# Patient Record
Sex: Female | Born: 1970 | Race: White | Hispanic: No | Smoking: Never smoker
Health system: Southern US, Community
[De-identification: ages and names within clinical notes are randomized; demographics above are authoritative.]

## PROBLEM LIST (undated history)

## (undated) VITALS — BP 102/68 | HR 94 | Temp 98.7°F | Resp 16 | Ht 62.0 in | Wt 185.0 lb

## (undated) VITALS — BP 122/60 | HR 80 | Temp 97.4°F | Resp 20 | Ht 62.75 in | Wt 199.0 lb

## (undated) DIAGNOSIS — M1990 Unspecified osteoarthritis, unspecified site: Secondary | ICD-10-CM

## (undated) DIAGNOSIS — F329 Major depressive disorder, single episode, unspecified: Secondary | ICD-10-CM

## (undated) DIAGNOSIS — G43909 Migraine, unspecified, not intractable, without status migrainosus: Secondary | ICD-10-CM

## (undated) DIAGNOSIS — F4310 Post-traumatic stress disorder, unspecified: Secondary | ICD-10-CM

## (undated) DIAGNOSIS — F32A Depression, unspecified: Secondary | ICD-10-CM

## (undated) DIAGNOSIS — F3111 Bipolar disorder, current episode manic without psychotic features, mild: Secondary | ICD-10-CM

## (undated) DIAGNOSIS — F419 Anxiety disorder, unspecified: Secondary | ICD-10-CM

## (undated) HISTORY — DX: Post-traumatic stress disorder, unspecified: F43.10

## (undated) HISTORY — PX: CHOLECYSTECTOMY: SHX55

---

## 2011-03-25 ENCOUNTER — Encounter (HOSPITAL_COMMUNITY): Payer: Self-pay | Admitting: *Deleted

## 2011-03-25 ENCOUNTER — Ambulatory Visit (HOSPITAL_COMMUNITY)
Admission: RE | Admit: 2011-03-25 | Discharge: 2011-03-25 | Disposition: A | Discharge status: Home / Self Care | Payer: BC Managed Care – PPO | Attending: Psychiatry | Admitting: Psychiatry

## 2011-03-25 DIAGNOSIS — F411 Generalized anxiety disorder: Secondary | ICD-10-CM | POA: Insufficient documentation

## 2011-03-25 DIAGNOSIS — F331 Major depressive disorder, recurrent, moderate: Secondary | ICD-10-CM | POA: Insufficient documentation

## 2011-03-25 HISTORY — DX: Unspecified osteoarthritis, unspecified site: M19.90

## 2011-03-25 HISTORY — DX: Migraine, unspecified, not intractable, without status migrainosus: G43.909
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
## 2011-04-09 ENCOUNTER — Inpatient Hospital Stay (HOSPITAL_COMMUNITY)
Admission: RE | Admit: 2011-04-09 | Discharge: 2011-04-13 | DRG: 430 | Disposition: A | Discharge status: Home / Self Care | Payer: BC Managed Care – PPO | Attending: Psychiatry | Admitting: Psychiatry

## 2011-04-09 ENCOUNTER — Encounter (HOSPITAL_COMMUNITY): Payer: Self-pay | Admitting: *Deleted

## 2011-04-09 ENCOUNTER — Emergency Department (HOSPITAL_COMMUNITY)
Admission: EM | Admit: 2011-04-09 | Discharge: 2011-04-09 | Disposition: A | Discharge status: Other Acute Inpatient Hospital | Payer: BC Managed Care – PPO | Attending: Emergency Medicine | Admitting: Emergency Medicine

## 2011-04-09 DIAGNOSIS — IMO0002 Reserved for concepts with insufficient information to code with codable children: Secondary | ICD-10-CM

## 2011-04-09 DIAGNOSIS — F4310 Post-traumatic stress disorder, unspecified: Secondary | ICD-10-CM | POA: Diagnosis present

## 2011-04-09 DIAGNOSIS — F329 Major depressive disorder, single episode, unspecified: Secondary | ICD-10-CM | POA: Insufficient documentation

## 2011-04-09 DIAGNOSIS — F339 Major depressive disorder, recurrent, unspecified: Principal | ICD-10-CM | POA: Diagnosis present

## 2011-04-09 DIAGNOSIS — F411 Generalized anxiety disorder: Secondary | ICD-10-CM | POA: Diagnosis present

## 2011-04-09 DIAGNOSIS — K219 Gastro-esophageal reflux disease without esophagitis: Secondary | ICD-10-CM

## 2011-04-09 DIAGNOSIS — R45851 Suicidal ideations: Secondary | ICD-10-CM

## 2011-04-09 DIAGNOSIS — L4050 Arthropathic psoriasis, unspecified: Secondary | ICD-10-CM

## 2011-04-09 DIAGNOSIS — Z79899 Other long term (current) drug therapy: Secondary | ICD-10-CM

## 2011-04-09 DIAGNOSIS — G43909 Migraine, unspecified, not intractable, without status migrainosus: Secondary | ICD-10-CM

## 2011-04-09 HISTORY — DX: Depression, unspecified: F32.A

## 2011-04-09 HISTORY — DX: Anxiety disorder, unspecified: F41.9

## 2011-04-09 HISTORY — DX: Major depressive disorder, single episode, unspecified: F32.9

## 2011-04-09 LAB — COMPREHENSIVE METABOLIC PANEL
ALT: 11 U/L (ref 0–35)
Albumin: 3.6 g/dL (ref 3.5–5.2)
Alkaline Phosphatase: 71 U/L (ref 39–117)
GFR calc Af Amer: >90 mL/min (ref >90)
Glucose, Bld: 144 mg/dL — ABNORMAL HIGH (ref 70–99)
Potassium: 3.6 mEq/L (ref 3.5–5.1)
Sodium: 136 mEq/L (ref 135–145)
Total Protein: 7.2 g/dL (ref 6.0–8.3)

## 2011-04-09 LAB — ETHANOL: Alcohol, Ethyl (B): <11 mg/dL (ref 0–11)

## 2011-04-09 LAB — CBC
Hemoglobin: 14.4 g/dL (ref 12.0–15.0)
MCHC: 34.6 g/dL (ref 30.0–36.0)

## 2011-04-09 LAB — RAPID URINE DRUG SCREEN, HOSP PERFORMED
Amphetamines: NOT DETECTED
Barbiturates: NOT DETECTED
Tetrahydrocannabinol: NOT DETECTED

## 2011-04-09 LAB — PREGNANCY, URINE: Preg Test, Ur: NEGATIVE

## 2011-04-09 MED ORDER — ZOLPIDEM TARTRATE 5 MG PO TABS: 5.0000 mg | ORAL_TABLET | Freq: Every evening | ORAL | Status: DC | PRN

## 2011-04-09 MED ORDER — ACETAMINOPHEN 325 MG PO TABS: 650.0000 mg | ORAL_TABLET | Freq: Four times a day (QID) | ORAL | Status: DC | PRN

## 2011-04-09 MED ORDER — ALUM & MAG HYDROXIDE-SIMETH 200-200-20 MG/5ML PO SUSP: 30.0000 mL | ORAL | Status: DC | PRN

## 2011-04-09 MED ORDER — LORAZEPAM 1 MG PO TABS: 1.0000 mg | ORAL_TABLET | Freq: Three times a day (TID) | ORAL | Status: DC | PRN

## 2011-04-09 MED ORDER — IBUPROFEN 600 MG PO TABS
600.0000 mg | ORAL_TABLET | Freq: Three times a day (TID) | ORAL | Status: DC | PRN
  Administered 2011-04-12: 600 mg via ORAL
  Filled 2011-04-09: qty 1

## 2011-04-09 MED ORDER — LORAZEPAM 1 MG PO TABS
1.0000 mg | ORAL_TABLET | Freq: Three times a day (TID) | ORAL | Status: DC | PRN
  Administered 2011-04-09: 1 mg via ORAL
  Filled 2011-04-09: qty 1

## 2011-04-09 MED ORDER — VENLAFAXINE HCL ER 150 MG PO CP24
150.0000 mg | ORAL_CAPSULE | Freq: Every day | ORAL | Status: DC
  Administered 2011-04-10 – 2011-04-13 (×4): 150 mg via ORAL
  Filled 2011-04-09 (×4): qty 1
  Filled 2011-04-09: qty 2
  Filled 2011-04-09: qty 1

## 2011-04-09 MED ORDER — MAGNESIUM HYDROXIDE 400 MG/5ML PO SUSP: 30.0000 mL | Freq: Every day | ORAL | Status: DC | PRN

## 2011-04-09 MED ORDER — ZOLPIDEM TARTRATE 5 MG PO TABS
5.0000 mg | ORAL_TABLET | Freq: Every evening | ORAL | Status: DC | PRN
  Administered 2011-04-09: 5 mg via ORAL
  Filled 2011-04-09: qty 1

## 2011-04-09 MED ORDER — ACETAMINOPHEN 325 MG PO TABS: 650.0000 mg | ORAL_TABLET | ORAL | Status: DC | PRN

## 2011-04-09 MED ORDER — ONDANSETRON HCL 4 MG PO TABS: 4.0000 mg | ORAL_TABLET | Freq: Three times a day (TID) | ORAL | Status: DC | PRN

## 2011-04-09 MED ORDER — IBUPROFEN 600 MG PO TABS: 600.0000 mg | ORAL_TABLET | Freq: Three times a day (TID) | ORAL | Status: DC | PRN

## 2011-04-09 NOTE — ED Notes (Signed)
Pt states this morning she cut her wrists bilat in an attempt to kill herself.  Pt states before she started to cut herself she "wanted to die, but once I started I couldn't do it."  Pt has superficial scratches to anterior wrists bilat.  Pt states suicidal thoughts have been increasing over past week.  Pt states she has had thoughts of hurting herself in past, but denies every acting on those thoughts.  Pt was seen at [facility] and assessed by ACT team prior to coming over to [facility].  Pt denies use of drugs or alcohol.
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
## 2011-04-09 NOTE — ED Notes (Signed)
Chris RN contacted-ok to transport w/o preg results

## 2011-04-09 NOTE — ED Notes (Signed)
Pt transported to [facility]  By security w/ RN. Husband to meet pt there
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
## 2011-04-09 NOTE — ED Notes (Signed)
Security contacted for transport.

## 2011-04-09 NOTE — ED Notes (Signed)
Bobby-ACT-into see 

## 2011-04-09 NOTE — ED Provider Notes (Addendum)
History     CSN: [id number] Arrival date & time: 04/09/2011 11:41 AM   First MD Initiated Contact with Patient 04/09/11 1159      Chief Complaint  Patient presents with  . Suicidal    (Consider location/radiation/quality/duration/timing/severity/associated sxs/prior treatment) The history is provided by the patient.   40 year old female with a long history of depression states that her depression has been worse over the last week. There is no specific precipitating event, but she has been having flashbacks during this time. Having suicidal thoughts during this time and this morning she tried to kill herself. She scratched her face and tried to cut her left wrist. She has had associated symptoms of early morning awakening and anhedonia, but she has not had crying spells. Symptoms are described as severe. Some better, nothing makes them worse. Past Medical History  Diagnosis Date  . Arthritis   . Migraine headache   . Depression   . Anxiety     Past Surgical History  Procedure Date  . Cholecystectomy     No family history on file.  History  Substance Use Topics  . Smoking status: Never Smoker   . Smokeless tobacco: Never Used  . Alcohol Use: No    OB History    Grav Para Term Preterm Abortions TAB SAB Ect Mult Living                  Review of Systems  All other systems reviewed and are negative.    Allergies  Seasonal  Home Medications   Current Outpatient Rx  Name Route Sig Dispense Refill  . ALPRAZOLAM ER 1 MG PO TB24 Oral Take 1 mg by mouth 2 (two) times daily.      Marland Kitchen CETIRIZINE HCL 10 MG PO TABS Oral Take 10 mg by mouth as needed. Pt did not have dosage information     . ETANERCEPT 50 MG/ML [place] SOLN Subcutaneous Inject 50 mg into the skin once a week. Pt administers on Sundays     . MELATONIN 10 MG PO TABS Oral Take 10 mg by mouth at bedtime as needed. sleep     . NORGESTIMATE-ETH ESTRADIOL 0.25-35 MG-MCG PO TABS Oral Take 1 tablet by mouth daily.        . SUMATRIPTAN SUCCINATE 100 MG PO TABS Oral Take 100 mg by mouth as needed.      . VENLAFAXINE HCL 150 MG PO CP24 Oral Take 150 mg by mouth daily.      Marland Kitchen ZOLPIDEM TARTRATE 10 MG PO TABS Oral Take 10 mg by mouth at bedtime as needed. Pt takes every other night due to cost.     . FLUTICASONE PROPIONATE 50 MCG/ACT NA SUSP Nasal Place 2 sprays into the nose daily. Pt did not have dosage information.       BP 116/50  Pulse 86  Temp(Src) 98.5 F (36.9 C) (Oral)  Resp 18  SpO2 99%  Physical Exam  Nursing note and vitals reviewed.  40-year-old female who appears overtly depressed but in no acute distress.. Head is normocephalic and atraumatic. PERRLA, EOMI. Oropharynx is clear. Neck is supple without adenopathy or JVD. Back is nontender. Lungs are clear without rales, wheezes, or rhonchi. Heart has regular rate and rhythm without murmur. Abdomen is soft, flat, nontender without masses or hepatosplenomegaly. These have no cyanosis or edema. Skin is warm and moist without rash. Neurologic: He is awake, alert, oriented x3. Cranial nerves are intact. There no focal motor or sensory deficits.  Psychiatric: She appears depressed with a flat affect. She makes poor eye contact and speaks in a soft monotone. ED Course  Procedures (including critical care time)   Labs Reviewed  CBC  COMPREHENSIVE METABOLIC PANEL  URINE RAPID DRUG SCREEN (HOSP PERFORMED)  ETHANOL   No results found. Results for orders placed during the hospital encounter of 04/09/11  CBC      Component Value Range   WBC 8.9  4.0 - 10.5 (K/uL)   RBC 4.62  3.87 - 5.11 (MIL/uL)   Hemoglobin 14.4  12.0 - 15.0 (g/dL)   HCT 40.9  81.1 - 91.4 (%)   MCV 90.0  78.0 - 100.0 (fL)   MCH 31.2  26.0 - 34.0 (pg)   MCHC 34.6  30.0 - 36.0 (g/dL)   RDW 78.2  95.6 - 21.3 (%)   Platelets 294  150 - 400 (K/uL)  COMPREHENSIVE METABOLIC PANEL      Component Value Range   Sodium 136  135 - 145 (mEq/L)   Potassium 3.6  3.5 - 5.1 (mEq/L)   Chloride  100  96 - 112 (mEq/L)   CO2 27  19 - 32 (mEq/L)   Glucose, Bld 144 (*) 70 - 99 (mg/dL)   BUN 13  6 - 23 (mg/dL)   Creatinine, Ser 0.86  0.50 - 1.10 (mg/dL)   Calcium 9.6  8.4 - 57.8 (mg/dL)   Total Protein 7.2  6.0 - 8.3 (g/dL)   Albumin 3.6  3.5 - 5.2 (g/dL)   AST 19  0 - 37 (U/L)   ALT 11  0 - 35 (U/L)   Alkaline Phosphatase 71  39 - 117 (U/L)   Total Bilirubin 0.4  0.3 - 1.2 (mg/dL)   GFR calc non Af Amer >90  >90 (mL/min)   GFR calc Af Amer >90  >90 (mL/min)  URINE RAPID DRUG SCREEN (HOSP PERFORMED)      Component Value Range   Opiates NONE DETECTED  NONE DETECTED    Cocaine NONE DETECTED  NONE DETECTED    Benzodiazepines POSITIVE (*) NONE DETECTED    Amphetamines NONE DETECTED  NONE DETECTED    Tetrahydrocannabinol NONE DETECTED  NONE DETECTED    Barbiturates NONE DETECTED  NONE DETECTED   ETHANOL      Component Value Range   Alcohol, Ethyl (B) <11  0 - 11 (mg/dL)   No results found.    1. Major depression    Patient has been accepted at [facility] and will be discharged.   MDM  Major depression with suicidal ideation and weak suicide attempts. She will need inpatient psychiatric care.        Dione Booze, MD 04/09/11 1430  Dione Booze, MD 05/19/11 [phone]

## 2011-04-09 NOTE — ED Notes (Signed)
1 bag of belongings sent w/ pt 

## 2011-04-10 DIAGNOSIS — F411 Generalized anxiety disorder: Secondary | ICD-10-CM | POA: Diagnosis present

## 2011-04-10 DIAGNOSIS — F339 Major depressive disorder, recurrent, unspecified: Secondary | ICD-10-CM | POA: Diagnosis present

## 2011-04-10 DIAGNOSIS — F4310 Post-traumatic stress disorder, unspecified: Secondary | ICD-10-CM | POA: Diagnosis present

## 2011-04-10 DIAGNOSIS — F39 Unspecified mood [affective] disorder: Secondary | ICD-10-CM

## 2011-04-10 MED ORDER — ALPRAZOLAM ER 0.5 MG PO TB24
1.0000 mg | ORAL_TABLET | Freq: Two times a day (BID) | ORAL | Status: DC
  Administered 2011-04-10 – 2011-04-13 (×6): 1 mg via ORAL
  Filled 2011-04-10 (×6): qty 2

## 2011-04-10 MED ORDER — NORGESTIMATE-ETH ESTRADIOL 0.25-35 MG-MCG PO TABS
1.0000 | ORAL_TABLET | Freq: Every day | ORAL | Status: DC
  Administered 2011-04-10 – 2011-04-12 (×3): 1 via ORAL

## 2011-04-10 MED ORDER — ETANERCEPT 50 MG/ML ~~LOC~~ SOLN
50.0000 mg | SUBCUTANEOUS | Status: DC
  Administered 2011-04-10: 50 mg via SUBCUTANEOUS

## 2011-04-10 MED ORDER — NORGESTIMATE-ETH ESTRADIOL 0.25-35 MG-MCG PO TABS: 1.0000 | ORAL_TABLET | Freq: Every day | ORAL | Status: DC

## 2011-04-10 MED ORDER — LORATADINE 10 MG PO TABS
10.0000 mg | ORAL_TABLET | Freq: Every day | ORAL | Status: DC | PRN
  Administered 2011-04-10 – 2011-04-12 (×3): 10 mg via ORAL
  Filled 2011-04-10 (×3): qty 1

## 2011-04-10 MED ORDER — QUETIAPINE FUMARATE 50 MG PO TABS
50.0000 mg | ORAL_TABLET | Freq: Every day | ORAL | Status: DC
  Administered 2011-04-10: 50 mg via ORAL
  Filled 2011-04-10 (×3): qty 1

## 2011-04-10 MED ORDER — QUETIAPINE FUMARATE 25 MG PO TABS: 25.0000 mg | ORAL_TABLET | Freq: Four times a day (QID) | ORAL | Status: DC | PRN

## 2011-04-10 MED ORDER — SALINE SPRAY 0.65 % NA SOLN
1.0000 | Freq: Three times a day (TID) | NASAL | Status: DC
  Administered 2011-04-10 – 2011-04-13 (×10): 1 via NASAL
  Filled 2011-04-10: qty 44

## 2011-04-10 MED ORDER — SUMATRIPTAN SUCCINATE 100 MG PO TABS
100.0000 mg | ORAL_TABLET | ORAL | Status: DC | PRN
  Administered 2011-04-12 (×2): 100 mg via ORAL
  Filled 2011-04-10 (×2): qty 1
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
## 2011-04-10 NOTE — Progress Notes (Signed)
[facility] Group Notes:  (Counselor/Nursing/MHT/Case Management/Adjunct)  04/10/2011 2:27 PM  Type of Therapy:  group therapy  Participation Level:  Minimal  Participation Quality:  Drowsy  Affect:  Flat  Cognitive:  Oriented  Insight:  Limited  Engagement in Group:  Limited  Engagement in Therapy:  Limited  Modes of Intervention:  Problem-solving, Support and exploration  Summary of Progress/Problems: Pt shared support for her is being heard by others, pt stated if he supports fell through she could turn to church. Pt shared to support herself she plans to say 25 positive things to herself.   Purcell Nails 04/10/2011, 2:27 PM

## 2011-04-10 NOTE — Progress Notes (Signed)
Patient stated she is feeling better today.  Denied SI and HI.   Denied A/V hallucinations.   Denied pain.   Groups are very helpful to her today.   Has been cooperative and pleasant.

## 2011-04-10 NOTE — Progress Notes (Signed)
[facility] Adult Inpatient Family/Significant Other Suicide Prevention Education  Suicide Prevention Education:  Education Completed; Alinda Money (husband) [phone],  (name of family member/significant other) has been identified by the patient as the family member/significant other with whom the patient will be residing, and identified as the person(s) who will aid the patient in the event of a mental health crisis (suicidal ideations/suicide attempt).  With written consent from the patient, the family member/significant other has been provided the following suicide prevention education, prior to the and/or following the discharge of the patient.  The suicide prevention education provided includes the following:  Suicide risk factors  Suicide prevention and interventions  National Suicide Hotline telephone number  [facility] assessment telephone number  [facility] Emergency Assistance 911  [facility] and/or Residential Mobile Crisis Unit telephone number  Request made of family/significant other to:  Remove weapons (e.g., guns, rifles, knives), all items previously/currently identified as safety concern.  ESP get kitchen knives out of site and reach.  Remove drugs/medications (over-the-counter, prescriptions, illicit drugs), all items previously/currently identified as a safety concern.  The family member/significant other verbalizes understanding of the suicide prevention education information provided. The family member/significant other agrees to remove the items of safety concern listed above.  Pt. accepted information on suicide prevention, warning signs to look for with suicide and crisis line numbers to use. The pt. agreed to call crisis line numbers if having warning signs or having thoughts of suicide.    [facility] 04/10/2011, 10:58 AM

## 2011-04-10 NOTE — Progress Notes (Signed)
Pt had a good day today. Spent time with a counselor and then in group. States that she has heard things her whole life  And it has never hit her or has she been open to the information as she was today. States that  She has finally come to terms with the fact that she wants to be very good to herself. Denies SI and HI. Given support, reassurance and praise.
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
## 2011-04-10 NOTE — Progress Notes (Signed)
Patient ID: Katelyn [name], female   DOB: 03-29-1971, 40 y.o.   MRN: [id number] Pt. denies lethality or A/V/H's: asked for and was given Ambien and Ativan to help her sleep: she reports ongoing issues with insomnia.  Pt. Has been interacting in the DR without a problem. 06:00--No problems though the night.
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
## 2011-04-10 NOTE — Progress Notes (Signed)
10:22 AM 04/10/2011 Pt attended after care group, pt received and understood suicide prevention info. Including who is at risk, warning signs, what to do, and who to call.

## 2011-04-11 LAB — TSH: TSH: 1.685 u[IU]/mL (ref 0.350–4.500)

## 2011-04-11 MED ORDER — TRAZODONE 25 MG HALF TABLET
25.0000 mg | ORAL_TABLET | Freq: Every evening | ORAL | Status: DC | PRN
  Administered 2011-04-11 – 2011-04-12 (×2): 25 mg via ORAL
  Filled 2011-04-11 (×8): qty 1

## 2011-04-11 NOTE — Progress Notes (Addendum)
[facility] Group Notes: (Counselor/Nursing/MHT/Case Management/Adjunct)   Type of Therapy:  Group Therapy  Participation Level:  Active  Participation Quality:  Attentive, Appropriate, Sharing  Affect:  Blunted  Cognitive:  Appropriate  Insight:  Good  Engagement in Group: Good  Engagement in Therapy:  Good  Modes of Intervention:  Support and Exploration and Education  Summary of Progress/Problems: Katelyn [name] participated in breathing and progressive muscle relaxation techniques. She processed the experience afterward, saying that her therapist has tried to work with her in the past on mindfulness, but she never really understood the concept until she did this exercise. Katelyn [name] stated that she was able to clear her mind of other things and be fully present in her body in the moment.  [name] 04/11/2011  3:58 PM

## 2011-04-11 NOTE — Tx Team (Signed)
Interdisciplinary Treatment Plan Update (Adult)  Date:  04/11/2011  Time Reviewed:  10:16 AM   Progress in Treatment: Attending groups: Yes Participating in groups:  Yes, open with staff and other group members  Taking medication as prescribed:  Yes Tolerating medication: Yes Family/Significant othe contact made:  Yes, contact made with:husband, Alinda Money  Patient understands diagnosis: Yes Discussing patient identified problems/goals with staff:  Yes Medical problems stabilized or resolved: Yes Denies suicidal/homicidal ideation: Yes Issues/concerns per patient self-inventory:  No  Other:  New problem(s) identified: None  Reason for Continuation of Hospitalization: Depression  Interventions implemented related to continuation of hospitalization:  Medication stabilization, safety checks q 15 mins, group attendance  Additional comments:  Estimated length of stay: 2 days  Discharge Plan: discharge home, follow up with Telford Nab in [facility] goal(s):  Review of initial/current patient goals per problem list:   1.  Goal(s): Decrease depressive symptoms  Met:  No  Target date: by discharge  As evidenced by: Katelyn [name] rates depression at 5 today (goal is 3)  2.  Goal (s): Eliminate SI   Met:  Yes  Target date: by discharge  As evidenced by: Breshay denies SI today  3.  Goal(s): Increase comfort with medications and intent to take medications  Met:  Yes  Target date: by discharge  As evidenced by: Mattisyn reports medications and working and no intolerable side effects   Attendees: Patient:   04/11/2011 10:16 AM  Family:   04/11/2011 10:16 AM  Physician:   04/11/2011 10:16 AM  Nursing:   Quintella Reichert, RN 04/11/2011 10:16 AM  Case Manager:  Vanetta Mulders LPCA 04/11/2011 10:16 AM  Counselor:  Angus Palms, LCSW 04/11/2011 10:16 AM  Other:  Reyes Ivan, LCSWA 04/11/2011 10:16 AM  Other:  Lynann Bologna, NP 04/11/2011 10:16 AM  Other:   04/11/2011  10:16 AM  Other:   04/11/2011 10:16 AM   Scribe for Treatment Team:   [name], 04/11/2011 10:16 AM

## 2011-04-11 NOTE — Progress Notes (Signed)
Lying quietly in bed with eyes closed.  No complaints of pain or other discomfort.  Q 15 minute checks in place to maintain safety. The patient is asleep and safe at this time.

## 2011-04-11 NOTE — Progress Notes (Signed)
Patient has been cooperative and pleasant today.   Denied SI and HI, contracts for safety.   Denied pain.  Denied A/V hallucinations. On self inventory sheet, patient sleeps well, has improving appetite, low energy level, improving attention span.  Rated depression #5, hopelessness #1.  Denied withdrawals.  Denied SI.  Physical problems in past 24 hours, lightheaded, nausea.  Rated worst pain #3.  Plans to write in journal and swim after discharge to improve herself.

## 2011-04-11 NOTE — Progress Notes (Signed)
2:37 PM 04/11/2011                                   Case Management Note Pt shared her depression levels are at 5 today and her anxiety is at 1, pt states she will be returning home to her husband and he is able to provide transportation. Pt states she has both a Psy. And therapist in [place], pt will be set up for discharge appointments. Demari Gales, LPCA

## 2011-04-11 NOTE — Progress Notes (Addendum)
Recreation Therapy Group Note  Date: 04/11/2011         Time: 1145      Group Topic/Focus: Patient invited to participate in animal assisted therapy. Pets as a coping skill and responsibility were discussed.   Participation Level: Active  Participation Quality: Appropriate and Attentive  Affect: Appropriate  Cognitive: Appropriate and Oriented   Additional Comments: None

## 2011-04-11 NOTE — Progress Notes (Signed)
Patient ID: Katelyn [name], female   DOB: 1970/10/16, 40 y.o.   MRN: [id number]  Doing much better today.  Feels she had a 'breakthrough' with one of the counselors yesterday and has had no more suicidal thoughts.  We had started her on Seroquel for flashbacks but it has made her excessively sedated so we will stop that now, and just use Trazodone prn for sleep - since her insurance will not pay for the Ambien.    We discussed the need to consider another medication if the flashbacks recur or persist.  No flashbacks in the past 24 hours.    She is wondering if she can go home since she feels so much better and we are considering discharge tomorrow if she continues to do well.  Her husband is agreeable with her coming home.    Plan:  D/C Seroquel Trazodone 25mg  hs prn, mr x 1

## 2011-04-11 NOTE — Progress Notes (Signed)
[facility] Group Notes: (Counselor/Nursing/MHT/Case Management/Adjunct)   Type of Therapy:  Group Therapy  Participation Level:  Active  Participation Quality:  Attentive, Appropriate, Sharing  Affect:  Blunted  Cognitive:  Appropriate  Insight:  Good  Engagement in Group: Good  Engagement in Therapy:  Good  Modes of Intervention:  Support and Exploration  Summary of Progress/Problems: Katelyn [name] explored the obstacles to her wellness, which she identified as going back in to the outside world and being overwhelmed with stressors such as work, parenting, relationships. She shared that to her everyone is dealing with their own personal "demon" and must make a choice each day whether they are going to face the demon that day. Katelyn [name] processed what it means to her to choose to face the things she has struggled with for years and identified fear that she will not be strong enough.  [name] 04/11/2011  3:36 PM

## 2011-04-12 NOTE — Progress Notes (Signed)
Patient ID: Katelyn [name], female   DOB: 10-12-1970, 40 y.o.   MRN: [id number] Patient has been cooperative and pleasant this morning.   Denied SI and HI.   Denied A/V hallucinations.   Denied pain. On self inventory, patient sleeps well, has improving appetite, low energy level, good attention span.  Rated depression #5 and hopelessness #2.   Denied withdrawals symptoms.  Denied SI.   Has experienced headaches in past 24 hours.  Rated worst pain #2.  After discharge, plans to exercise, write in journal, paint/draw.
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
## 2011-04-12 NOTE — Progress Notes (Addendum)
In hallway waiting for medications on approach. Appears flat . Calm and cooperative with assessment. A/Ox4. No acute distress noted. States she has had a good day. When asked to qualify good day, pointed to groups as a good part of day. States that she has heard a lot of the things from the groups before but for some reason the information sunk in a little better today. States she had been trying meditation, but could not get into it. However, today she felt like she was able to listen to the tape we provide and felt much more relaxed afterwards. Support and encouragement provided. Otherwise no questions or concerns. Denies pain or discomfort. Denies SI/HI/AVH and contracts for safety. Safety has been maintained with Q22minute observation. POC and medications for the shift reviewed and understanding verbalized. Will continue current POC.

## 2011-04-12 NOTE — Progress Notes (Signed)
In hallway on approach. Has been visible but minimally interacting. Appears flat but brightens at intervals. Calm and cooperative with assessment. A/Ox4. No acute distress noted. States she has had a good day, but states she continues to have the "beginnings of a migraine.". Pt was treated earlier in the day with imitrex, but she states she typically will take naprosyn at the same time and she gets good relief. After reviewing prns decided to give additional imitrex and motrin. States she feels like this combination should work well. Support and encouragement provided. Offered no additional questions or concerns. Denies SI/HI/AVH and contracts for safety. Safety has been maintained with Q78minute observation. POC and medications for the shift reviewed and understanding verbalized. Will f/u response to prns and continue current POC.

## 2011-04-12 NOTE — Progress Notes (Signed)
Grief Loss Group  Group focused on losses primarily related to relationships. Discussion also occurred around the experience of grief feelings and reactions and how to cope in healthy ways. Pt. Took part in the group conversation and acknowledged feelings of grief with which she is dealing.  Kathlyn Sacramento M.Div. [facility]

## 2011-04-12 NOTE — Progress Notes (Signed)
[facility] Group Notes: (Counselor/Nursing/MHT/Case Management/Adjunct)   Type of Therapy:  Group Therapy  Participation Level:  Active  Participation Quality:  Attentive, Sharing, Appropriate  Affect:  Blunted  Cognitive:  Appropriate  Insight:  Good  Engagement in Group: Good   Engagement in Therapy:  Good  Modes of Intervention:  Support and Exploration and Education  Summary of Progress/Problems: Katelyn [name] explored the concept of being "sick", stating that somewhere in life people start looking at it as an incurable, stigmatized thing rather than a challenge to be overcome. She talked about the difficulty of others looking at her as though is other than them, and her response to being labeled by others. Katelyn [name] seemed to relate to the Recovery Model perspective as she believes she is able to do more than just function.  [name] 04/12/2011  2:23 PM

## 2011-04-12 NOTE — Progress Notes (Signed)
11:08 AM 04/12/2011                                        Case Management Note Pt attended after care planning group, pt has brighter affect than yesterday, pt said she is feeling better but feels she may need another day and wants to talk 1:1 with therapist if possible. Depression 4, Anxiety at 1 or 2, pt no current SI.

## 2011-04-13 MED ORDER — TRAZODONE 25 MG HALF TABLET
25.0000 mg | ORAL_TABLET | Freq: Every day | ORAL | Status: DC
  Filled 2011-04-13 (×2): qty 1

## 2011-04-13 MED ORDER — TRAZODONE 25 MG HALF TABLET: 25.0000 mg | ORAL_TABLET | Freq: Every evening | ORAL | Status: DC | PRN

## 2011-04-13 MED ORDER — TRAZODONE 25 MG HALF TABLET: 25.0000 mg | ORAL_TABLET | Freq: Every day | ORAL | Status: DC

## 2011-04-13 MED ORDER — ALPRAZOLAM ER 1 MG PO TB24: 1.0000 mg | ORAL_TABLET | Freq: Two times a day (BID) | ORAL | Status: AC

## 2011-04-13 MED ORDER — TRAZODONE HCL 50 MG PO TABS: 25.0000 mg | ORAL_TABLET | Freq: Every day | ORAL | Status: DC

## 2011-04-13 NOTE — Progress Notes (Signed)
Interdisciplinary Treatment Plan Update (Adult)  Date:  04/13/2011  Time Reviewed:  10:49 AM   Progress in Treatment: Attending groups:   Yes   Participating in groups:  Yes Taking medication as prescribed:  Yes Tolerating medication:  Yes Family/Significant othe contact made: Counselor contacted husband Patient understands diagnosis:  Yes Discussing patient identified problems/goals with staff: Yes Medical problems stabilized or resolved: Yes Denies suicidal/homicidal ideation:Yes - [name] denies SI Issues/concerns per patient self-inventory:  Other:  New problem(s) identified:  Reason for Continuation of Hospitalization: Interventions implemented related to continuation of hospitalization:  Additional comments:  Suicide prevention education reviewed  Estimated length of stay: Discharge home today  Discharge Plan: Discharge home with husband.  Follow-up scheduled with Durwin Nora Assoicates  New goal(s):  Review of initial/current patient goals per problem list:    1.  Goal(s):  Eliminate SI  Met:  Yes  Target date: d/c  As evidenced by:  Currently denies SI   2.  Goal (s):Reduce Depression  Met:  Yes  Target date:d/c   As evidenced by:  Currently rates depression at three - rated depression at five on admission     3.  Goal(s):  Stabilize on medicaitons  Met:  Yes  Target date: d/c  As evidenced by: Reports meds are working - less symptomatic  4.  Goal(s): Arrange outpatient follow up  Met:  Yes  Target date: d/c  As evidenced by: Follow scheduled with therapist and MD for later this week  Attendees: Patient:  Katelyn [name] 04/13/2011 11:25 AM   Family:     Physician:  Orson Aloe, MD 04/13/2011 10:49 AM   Nursing:  Cato Mulligan, RN 04/13/2011 10:49 AM   CaseManager:  Juline Patch, LCSW 04/13/2011 10:49 AM   Counselor:  Angus Palms, LCSW 04/13/2011 10:49 AM   Other:  Consuello Bossier, NP 04/13/2011 10:49 AM   Other:  Reyes Ivan,  RN 04/13/2011 11:14 AM   Other:  Phebe Colla, RN 04/13/2011 11:21 AM   Other:      Scribe for Treatment Team:   Wynn Banker, LCSW,  04/13/2011 10:49 AM

## 2011-04-13 NOTE — Progress Notes (Signed)
Suicide Risk Assessment  Discharge Assessment     Demographic factors:  Assessment Details Time of Assessment: Discharge Information Obtained From: Patient Current Mental Status:    Risk Reduction Factors:  Risk Reduction Factors: Responsible for children under 37 years of age;Sense of responsibility to family;Employed;Living with another person, especially a relative;Positive social support;Positive therapeutic relationship;Positive coping skills or problem solving skills  CLINICAL FACTORS:   Severe Anxiety and/or Agitation Depression:   Hopelessness Insomnia Chronic Pain Previous Psychiatric Diagnoses and Treatments Medical Diagnoses and Treatments/Surgeries  COGNITIVE FEATURES THAT CONTRIBUTE TO RISK:  No Cognitive risk factors noted.   SUICIDE RISK:   Minimal: No identifiable suicidal ideation.  Patients presenting with no risk factors but with morbid ruminations; may be classified as minimal risk based on the severity of the depressive symptoms  Patient denies suicidal or homicidal ideation, hallucinations, illusions, or delusions. Patient engages with good eye contact, is able to focus adequately in a one to one setting, and has clear goal directed thoughts. Patient speaks with a natural conversational volume, rate, and tone. Anxiety was reported at 1 on a scale of 1 the least and 10 the most. Depression was reported also at 1 on the same scale. Patient is oriented times 4, recent and remote memory intact. Judgement: improved from admission Insight: Has learned several coping strategies and has had a renewed commitment to her creative side.  Plan:  Remain on . ALPRAZolam  1 mg Oral BID  . etanercept  50 mg Subcutaneous Weekly  . norgestimate-ethinyl estradiol  1 tablet Oral QHS  . sodium chloride  1 spray Each Nare TID  . traZODone  25 mg Oral 1/2 - 1 QHS  . venlafaxine  150 mg Oral Daily  Because Xanax helps with anxiety, but might be switched to Thorazine as it is not  addictive and does not make you forget what you just did, Embrel helps with arthritis, Birth control helps prevent pregnancies, Ocean Spray is not habit forming and helps with nasal congestion, Desyrel helps with insomnia, Effexor helps with depression. Pt voices understanding of the risks and benefits of medication. Follow-up is with Follow-up Information    Follow up with Dr. Melanee Left on 04/15/2011. (Your appointment with Dr. Blanchard Mane is Friday, April 15, 2011 at 2:15 p.m.)    Contact information:   [address] [place], [place]  [phone]      Follow up with [name] on 04/14/2011. (Your appointment with [name] is scheduled for Thursday, December 20, 202 at 11:30)    Contact information:   [address] [place], [place]  [address]  [phone]      Follow up on 04/14/2011.        Katelyn [name] 04/13/2011, 2:08 PM
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
## 2011-04-13 NOTE — Discharge Summary (Signed)
Discharge Note  Patient:  Katelyn [name] is an 40 y.o., female DOB:  05-25-1970  Date of Admission:  04/09/2011  Date of Discharge:  04/13/2011  Level of Care:  OP  Discharge destination:  HOME  Is patient on multiple antipsychotic therapies at discharge:  NO  Patient phone:  [phone] (home) Patient address:   [address] [place] [address]  The patient received suicide prevention pamphlet:  YES Belongings returned:  Valuables  Dan Humphreys, Zamiya Dillard 04/13/2011,2:22 PM

## 2011-04-13 NOTE — Progress Notes (Signed)
[facility] Group Notes: (Counselor/Nursing/MHT/Case Management/Adjunct)   Type of Therapy:  Group Therapy  Participation Level:  Active  Participation Quality:  Appropriate, Attentive, and Sharing  Affect:  Blunted  Cognitive:  Appropriate  Insight:  Good  Engagement in Group: Good  Engagement in Therapy:  Good  Modes of Intervention:  Support and Exploration  Summary of Progress/Problems: Katelyn [name] identified that she experienced a moment of happiness and meaning when psychiatrist shared that he was touched by her hope for herself and plans for recovery. She shared that she would like to use her artistic abilities to share the message of hope and recovery, and processed how this touching the doctor showed her that she can impact the lives of others.   [name] 04/13/2011  1:59 PM

## 2011-04-13 NOTE — Progress Notes (Signed)
Patient ID: Katelyn [name], female   DOB: 09-23-1970, 40 y.o.   MRN: [id number] Writer reviewed all discharge instructions with pt including medications, follow up appointments and crisis intervention. Pt verbalized understanding of all instructions. Pt denies SI/HI and states that she has no reservations about leaving [facility] at this time. Pt is in no distress and her mood and affect are appropriate to the situation. Pt has no belongings stored in our lockers. Pt is discharged into her own care.

## 2011-04-13 NOTE — Progress Notes (Signed)
Patient seen during during d/c planning group and or treatment team.  She reports being much improved and looking forward to discharging home today.  She denies SI/HI.  Katelyn [name] rates depression at three anxiety at five and hopelessness/helplessness at two.  Her outpatient follow up is scheduled with [facility]. Suicide prevention education reviewed.
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
## 2011-04-14 NOTE — Progress Notes (Signed)
Patient Discharge Instructions:  Admission Note Faxed,  04/14/2011 Discharge Note Faxed,   04/14/2011 After Visit Summary Faxed,  04/14/2011 Faxed to the Next Level Care provider:  04/14/2011 D/C Summary sent 04/14/2011 Facsheet sent 04/14/2011  No fax # available for [facility] - Dr. Blanchard Mane and Nunzio Cobbs. Sent certified mail to  [address] [place], [place] [address]  Wandra Scot, 04/14/2011, 3:15 PM

## 2011-04-27 ENCOUNTER — Encounter (HOSPITAL_COMMUNITY): Payer: Self-pay

## 2011-04-27 ENCOUNTER — Other Ambulatory Visit (HOSPITAL_COMMUNITY): Payer: BC Managed Care – PPO | Attending: Psychiatry

## 2011-04-27 DIAGNOSIS — IMO0002 Reserved for concepts with insufficient information to code with codable children: Secondary | ICD-10-CM | POA: Insufficient documentation

## 2011-04-27 DIAGNOSIS — F411 Generalized anxiety disorder: Secondary | ICD-10-CM | POA: Insufficient documentation

## 2011-04-27 DIAGNOSIS — F332 Major depressive disorder, recurrent severe without psychotic features: Secondary | ICD-10-CM | POA: Insufficient documentation

## 2011-04-27 DIAGNOSIS — F4310 Post-traumatic stress disorder, unspecified: Secondary | ICD-10-CM | POA: Insufficient documentation
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
## 2011-04-27 NOTE — Progress Notes (Signed)
    Daily Group Progress Note  Program: IOP  Group Time: 9:00 - 10:30 a.m.  Participation Level: Active  Behavioral Response: Appropriate, Sharing and Motivated  Type of Therapy:  Process Group  Summary of Progress: This is patient's first group.  She has recently been discharged from our adult inpatient hospital.  Patient came into the group late due to completing her assessment.  She did come in during a goodbye ceremony of another group member but did participate in that ceremony.  Group Time: 11:00 - noon  Participation Level:  Active  Behavioral Response: Appropriate, Sharing and Motivated  Type of Therapy: Psycho-education Group  Summary of Progress: Patient participated in a group on positive recovery skills including identifying resiliencies and completing a gratitude journal.  Patient's homework is to complete a gratitude list for tomorrow's group.
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
## 2011-04-28 ENCOUNTER — Other Ambulatory Visit (HOSPITAL_COMMUNITY): Payer: BC Managed Care – PPO
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
## 2011-04-29 ENCOUNTER — Other Ambulatory Visit (HOSPITAL_COMMUNITY): Payer: BC Managed Care – PPO
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
## 2011-05-02 ENCOUNTER — Other Ambulatory Visit (HOSPITAL_COMMUNITY): Payer: BC Managed Care – PPO
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
## 2011-05-03 ENCOUNTER — Other Ambulatory Visit (HOSPITAL_COMMUNITY): Payer: BC Managed Care – PPO
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
## 2011-05-04 ENCOUNTER — Other Ambulatory Visit (HOSPITAL_COMMUNITY): Payer: BC Managed Care – PPO
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
## 2011-05-05 ENCOUNTER — Other Ambulatory Visit (HOSPITAL_COMMUNITY): Payer: BC Managed Care – PPO

## 2011-05-05 NOTE — Progress Notes (Unsigned)
    Daily Group Progress Note  Program: IOP  Group Time:  9:00 - 10:30 a.m.  Participation Level: None  Behavioral Response:   Type of Therapy:  Process Group  Summary of Progress:  Patient cancelled, home with sick child.  Group Time:  11:00 - Noon  Participation Level:  None  Behavioral Response:  Type of Therapy: Psycho-education Group  Summary of Progress:  Patient not in group today.  Cleophas Dunker, LMFT, CTS

## 2011-05-06 ENCOUNTER — Other Ambulatory Visit (HOSPITAL_COMMUNITY): Payer: BC Managed Care – PPO

## 2011-05-09 ENCOUNTER — Other Ambulatory Visit (HOSPITAL_COMMUNITY): Payer: BC Managed Care – PPO

## 2011-05-09 MED ORDER — TRAZODONE HCL 50 MG PO TABS: 50.0000 mg | ORAL_TABLET | Freq: Every day | ORAL | Status: DC
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
## 2011-05-10 ENCOUNTER — Other Ambulatory Visit (HOSPITAL_COMMUNITY): Payer: BC Managed Care – PPO

## 2011-05-10 NOTE — Progress Notes (Unsigned)
    Daily Group Progress Note  Program: IOP  Group Time:  9:00 - 10:30 a.m.  Participation Level: Active  Behavioral Response: Appropriate, Sharing and Motivated  Type of Therapy:  Process Group  Summary of Progress:  Patient reports she feels better today and mainly helped other group members with how to balance recovery with parenting and having a husband.  Patient is focused on her recovery and reports she is doing her self-care and asking for what she needs from her husband.  Group Time:  11:00 - Noon  Participation Level:  Minimal  Behavioral Response: Appropriate  Type of Therapy: Psycho-education Group  Summary of Progress:  Patient participated in a group on discharge planning.  Patient reports she will continue therapy with her therapist when she leaves IOP.  Katelyn Dunker, LMFT, CTS

## 2011-05-11 ENCOUNTER — Other Ambulatory Visit (HOSPITAL_COMMUNITY): Payer: BC Managed Care – PPO
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
## 2011-05-12 ENCOUNTER — Other Ambulatory Visit (HOSPITAL_COMMUNITY): Payer: BC Managed Care – PPO

## 2011-05-12 DIAGNOSIS — F4310 Post-traumatic stress disorder, unspecified: Secondary | ICD-10-CM

## 2011-05-12 DIAGNOSIS — F332 Major depressive disorder, recurrent severe without psychotic features: Secondary | ICD-10-CM
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
## 2011-05-13 ENCOUNTER — Other Ambulatory Visit (HOSPITAL_COMMUNITY): Payer: BC Managed Care – PPO

## 2011-05-16 ENCOUNTER — Other Ambulatory Visit (HOSPITAL_COMMUNITY): Payer: BC Managed Care – PPO

## 2011-05-16 NOTE — Progress Notes (Unsigned)
    Daily Group Progress Note  Program: IOP  Group Time:  9:00 - 10:30 a.m.  Participation Level: Active  Behavioral Response: Appropriate, Sharing and Motivated  Type of Therapy:  Process Group  Summary of Progress:  Patient reports having a "great weekend."  She took her son to a park along with a friend who had a child her son's age.  The children played while patient and her friend talked for two hours.  She also reports being able to paint and draw this weekend, something that gives her pleasure.  Group Time:  11:00 - Noon  Participation Level:  Active  Behavioral Response: Appropriate  Type of Therapy: Psycho-education Group  Summary of Progress:  Patient participated in a group on assertive communication and personal rights.  Patient states she "has a right to have mixed feelings throughout her day as well as conflictual thoughts about being a parent.  She will ready her personal rights list daily while in IOP.  Cleophas Dunker, LMFT, CTS

## 2011-05-17 ENCOUNTER — Encounter (HOSPITAL_COMMUNITY): Payer: Self-pay

## 2011-05-17 ENCOUNTER — Ambulatory Visit (HOSPITAL_COMMUNITY): Payer: BC Managed Care – PPO | Admitting: Psychiatry

## 2011-05-17 ENCOUNTER — Other Ambulatory Visit (HOSPITAL_COMMUNITY): Payer: BC Managed Care – PPO

## 2011-05-17 NOTE — Progress Notes (Signed)
Patient ID: Katelyn [name], female   DOB: 1970-07-04, 41 y.o.   MRN: [id number] Patient states that she is eager for discharge. She listed her safety plan and support system. She denied SI/HI and stated that she knows that our assessment department is open 24/7 for support. Patient verbalized intent to follow-up with therapist and Dr. Robet [name] this week and states that she has enough medication to last until her appointments. No further case management needs voiced,

## 2011-05-17 NOTE — Patient Instructions (Addendum)
Dr Adonis Housekeeper on 1/24  @ 1 PM [name] LCSW on 1/23 @ 4 PM

## 2011-05-17 NOTE — Progress Notes (Unsigned)
    Daily Group Progress Note  Program: IOP  Group Time:  9:00 - 10:30 a.m.  Participation Level: Active  Behavioral Response: Appropriate, Sharing and Motivated  Type of Therapy:  Process Group  Summary of Progress:  This is patient's last group.  Patient states she is ready to stop group and that she got a great deal out of coming here.  She reports feeling "significantly better than when she came in."  Patient participated in her goodbye ceremony. The plan is to continue with her therapist.  Group Time:  11:00 - Noon  Participation Level:  Active  Behavioral Response: Appropriate  Type of Therapy: Psycho-education Group  Summary of Progress:  Patient participated in a group on anxiety relief.  Patient states she uses a technique to relieve anxiety where she "talks out loud" what she is feeling.  Cleophas Dunker, LMFT, CTS
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
## 2011-05-18 ENCOUNTER — Other Ambulatory Visit (HOSPITAL_COMMUNITY): Payer: BC Managed Care – PPO

## 2011-05-19 ENCOUNTER — Other Ambulatory Visit (HOSPITAL_COMMUNITY): Payer: BC Managed Care – PPO

## 2011-05-20 ENCOUNTER — Other Ambulatory Visit (HOSPITAL_COMMUNITY): Payer: BC Managed Care – PPO

## 2011-05-23 ENCOUNTER — Other Ambulatory Visit (HOSPITAL_COMMUNITY): Payer: BC Managed Care – PPO

## 2011-05-24 ENCOUNTER — Other Ambulatory Visit (HOSPITAL_COMMUNITY): Payer: BC Managed Care – PPO

## 2011-05-25 ENCOUNTER — Other Ambulatory Visit (HOSPITAL_COMMUNITY): Payer: BC Managed Care – PPO

## 2011-08-05 ENCOUNTER — Encounter: Payer: Self-pay | Admitting: Internal Medicine

## 2011-08-05 ENCOUNTER — Ambulatory Visit (INDEPENDENT_AMBULATORY_CARE_PROVIDER_SITE_OTHER): Payer: BC Managed Care – PPO | Admitting: Internal Medicine

## 2011-08-05 VITALS — BP 118/64 | HR 73 | Temp 98.0°F | Resp 16 | Ht 62.25 in | Wt 159.8 lb

## 2011-08-05 DIAGNOSIS — L408 Other psoriasis: Secondary | ICD-10-CM

## 2011-08-05 DIAGNOSIS — L259 Unspecified contact dermatitis, unspecified cause: Secondary | ICD-10-CM

## 2011-08-05 DIAGNOSIS — L409 Psoriasis, unspecified: Secondary | ICD-10-CM | POA: Insufficient documentation

## 2011-08-05 DIAGNOSIS — L4050 Arthropathic psoriasis, unspecified: Secondary | ICD-10-CM

## 2011-08-05 MED ORDER — CLOBETASOL PROPIONATE 0.05 % EX CREA: TOPICAL_CREAM | Freq: Two times a day (BID) | CUTANEOUS | Status: AC

## 2011-08-05 MED ORDER — PREDNISONE 10 MG PO TABS: ORAL_TABLET | ORAL | Status: DC

## 2011-08-05 NOTE — Progress Notes (Signed)
  Subjective:    Patient ID: Katelyn [name], female    DOB: [birth year], 41 y.o.   MRN: [id number]  HPI    Review of Systems     Objective:   Physical Exam        Assessment & Plan:  Contact dematitis

## 2011-08-05 NOTE — Patient Instructions (Signed)

## 2012-01-24 DIAGNOSIS — L4050 Arthropathic psoriasis, unspecified: Secondary | ICD-10-CM | POA: Insufficient documentation

## 2012-01-29 ENCOUNTER — Ambulatory Visit (INDEPENDENT_AMBULATORY_CARE_PROVIDER_SITE_OTHER): Payer: BC Managed Care – PPO | Admitting: Emergency Medicine

## 2012-01-29 VITALS — BP 110/66 | HR 76 | Temp 98.1°F | Resp 16 | Ht 62.5 in | Wt 155.4 lb

## 2012-01-29 DIAGNOSIS — J301 Allergic rhinitis due to pollen: Secondary | ICD-10-CM

## 2012-01-29 DIAGNOSIS — J019 Acute sinusitis, unspecified: Secondary | ICD-10-CM

## 2012-01-29 DIAGNOSIS — J329 Chronic sinusitis, unspecified: Secondary | ICD-10-CM

## 2012-01-29 DIAGNOSIS — J309 Allergic rhinitis, unspecified: Secondary | ICD-10-CM

## 2012-01-29 MED ORDER — PREDNISONE 10 MG PO TABS: ORAL_TABLET | ORAL | Status: DC

## 2012-01-29 MED ORDER — AZITHROMYCIN 250 MG PO TABS: ORAL_TABLET | ORAL | Status: DC

## 2012-01-29 NOTE — Progress Notes (Signed)
  Subjective:    Patient ID: Katelyn [name], female    DOB: [birth year], 41 y.o.   MRN: [id number]  HPI Cough x 7 days ago with body aches, fatigue, chest congestion, and facial pressure when bending down. Denies fever.    Review of Systems     Objective:   Physical Exam        Assessment & Plan:

## 2013-01-08 DIAGNOSIS — Z79899 Other long term (current) drug therapy: Secondary | ICD-10-CM | POA: Insufficient documentation

## 2013-03-06 ENCOUNTER — Ambulatory Visit (INDEPENDENT_AMBULATORY_CARE_PROVIDER_SITE_OTHER): Payer: BC Managed Care – PPO | Admitting: Family Medicine

## 2013-03-06 VITALS — BP 98/64 | HR 92 | Temp 98.4°F | Resp 18 | Ht 63.0 in | Wt 170.0 lb

## 2013-03-06 DIAGNOSIS — J029 Acute pharyngitis, unspecified: Secondary | ICD-10-CM

## 2013-03-06 DIAGNOSIS — J019 Acute sinusitis, unspecified: Secondary | ICD-10-CM

## 2013-03-06 DIAGNOSIS — R0982 Postnasal drip: Secondary | ICD-10-CM

## 2013-03-06 DIAGNOSIS — R509 Fever, unspecified: Secondary | ICD-10-CM

## 2013-03-06 DIAGNOSIS — R5381 Other malaise: Secondary | ICD-10-CM

## 2013-03-06 LAB — POCT CBC
Granulocyte percent: 51.5 % (ref 37–80)
HCT, POC: 45 % (ref 37.7–47.9)
Hemoglobin: 14.4 g/dL (ref 12.2–16.2)
Lymph, poc: 2.8 (ref 0.6–3.4)
MCH, POC: 31.3 pg — AB (ref 27–31.2)
MCHC: 32 g/dL (ref 31.8–35.4)
MCV: 97.9 fL — AB (ref 80–97)
MID (cbc): 0.6 (ref 0–0.9)
MPV: 10.2 fL (ref 0–99.8)
POC Granulocyte: 3.6 (ref 2–6.9)
POC LYMPH PERCENT: 40.5 % (ref 10–50)
POC MID %: 8 % (ref 0–12)
Platelet Count, POC: 254 10*3/uL (ref 142–424)
RBC: 4.6 M/uL (ref 4.04–5.48)
RDW, POC: 12.9 %
WBC: 6.9 10*3/uL (ref 4.6–10.2)

## 2013-03-06 LAB — POCT RAPID STREP A (OFFICE): Rapid Strep A Screen: NEGATIVE

## 2013-03-06 MED ORDER — PREDNISONE 10 MG PO TABS: ORAL_TABLET | ORAL | Status: DC

## 2013-03-06 MED ORDER — AZITHROMYCIN 250 MG PO TABS: ORAL_TABLET | ORAL | Status: DC

## 2013-03-06 NOTE — Patient Instructions (Signed)

## 2013-03-06 NOTE — Progress Notes (Signed)
 [facility]:  Office Visit  Chief Complaint:  Chief Complaint  Patient presents with  . Sinusitis    headache x6 days, sore throat, congestion, pressure, chills, aches x3 days     HPI: Katelyn [name] is a 42 y.o. female who is here for 6 day history of sore throat, sinus congestion, chills, fatigue, msk aches x 3 days. She has a history of migraines  and  Is on topiramate, and when she has beak through HAs she takes imitrex and normally relieves her HA. However she  took imitrex but was not helpful. She has a lot of eye pain behind her eyes and now her face. She has no ear pain. She has tried otc meds without relief. No CP, SOB, wheezes.   Past Medical History  Diagnosis Date  . Arthritis   . Migraine headache   . Depression   . Anxiety   . PTSD (post-traumatic stress disorder)    Past Surgical History  Procedure Laterality Date  . Cholecystectomy     History   Social History  . Marital Status: Married    Spouse Name: N/A    Number of Children: N/A  . Years of Education: N/A   Social History Main Topics  . Smoking status: Never Smoker   . Smokeless tobacco: Never Used  . Alcohol Use: No  . Drug Use: No  . Sexual Activity: Yes    Birth Control/ Protection: Pill   Other Topics Concern  . None   Social History Narrative  . None   Family History  Problem Relation Age of Onset  . Depression Father   . Alcohol abuse Sister   . Alcohol abuse Maternal Grandfather    Allergies  Allergen Reactions  . Cholestatin    Prior to Admission medications   Medication Sig Start Date End Date Taking? Authorizing Provider  ALPRAZolam (XANAX XR) 1 MG 24 hr tablet  04/30/11  Yes Historical Provider, MD  cetirizine (ZYRTEC) 10 MG tablet Take 10 mg by mouth as needed. Pt did not have dosage information    Yes Historical Provider, MD  etanercept (ENBREL) 50 MG/ML injection Inject 50 mg into the skin once a week. Pt administers on Sundays    Yes Historical  Provider, MD  fluticasone (FLONASE) 50 MCG/ACT nasal spray Place 2 sprays into the nose daily. Pt did not have dosage information.    Yes Historical Provider, MD  norgestimate-ethinyl estradiol (ORTHO-CYCLEN,SPRINTEC,PREVIFEM) 0.25-35 MG-MCG tablet Take 1 tablet by mouth daily.     Yes Historical Provider, MD  SUMAtriptan (IMITREX) 100 MG tablet Take 100 mg by mouth as needed.     Yes Historical Provider, MD  topiramate (TOPAMAX) 50 MG tablet Take 50 mg by mouth daily.   Yes Historical Provider, MD  venlafaxine (EFFEXOR-XR) 150 MG 24 hr capsule Take 150 mg by mouth daily.     Yes Historical Provider, MD  azithromycin (ZITHROMAX) 250 MG tablet Take 2 tabs PO x 1 dose, then 1 tab PO QD x 4 days 01/29/12   Collene Gobble, MD  Melatonin 10 MG TABS Take 10 mg by mouth at bedtime as needed. sleep     Historical Provider, MD  predniSONE (DELTASONE) 10 MG tablet Takes 6 a day for one day 5 a day for one day four a day for one day 3 a day for one day 2 a day for one day one a day for one day 01/29/12   Collene Gobble, MD  PRILOSEC OTC 20 MG tablet  05/09/11   Historical Provider, MD  traZODone (DESYREL) 50 MG tablet Take 1 tablet (50 mg total) by mouth at bedtime. 05/09/11   Gayland Curry, MD     ROS: The patient denies night sweats, unintentional weight loss, chest pain, palpitations, wheezing, dyspnea on exertion, nausea, vomiting, abdominal pain, dysuria, hematuria, melena, numbness, or tingling.   All other systems have been reviewed and were otherwise negative with the exception of those mentioned in the HPI and as above.    PHYSICAL EXAM: Filed Vitals:   03/06/13 1133  BP: 98/64  Pulse: 92  Temp: 98.4 F (36.9 C)  Resp: 18  Spo2   98% Filed Vitals:   03/06/13 1133  Height: 5\' 3"  (1.6 m)  Weight: 170 lb (77.111 kg)   Body mass index is 30.12 kg/(m^2).  General: Alert, no acute distress, tired appearing HEENT:  Normocephalic, atraumatic, oropharynx patent. EOMI, PERRLA, tm normal, +  sinus tenderness, boggy nares, no exudates Cardiovascular:  Regular rate and rhythm, no rubs murmurs or gallops.  No Carotid bruits, radial pulse intact. No pedal edema.  Respiratory: Clear to auscultation bilaterally.  No wheezes, rales, or rhonchi.  No cyanosis, no use of accessory musculature GI: No organomegaly, abdomen is soft and non-tender, positive bowel sounds.  No masses. Skin: No rashes. Neurologic: Facial musculature symmetric. Psychiatric: Patient is appropriate throughout our interaction. Lymphatic: No cervical lymphadenopathy Musculoskeletal: Gait intact.   LABS: Results for orders placed in visit on 03/06/13  POCT CBC      Result Value Range   WBC 6.9  4.6 - 10.2 K/uL   Lymph, poc 2.8  0.6 - 3.4   POC LYMPH PERCENT 40.5  10 - 50 %L   MID (cbc) 0.6  0 - 0.9   POC MID % 8.0  0 - 12 %M   POC Granulocyte 3.6  2 - 6.9   Granulocyte percent 51.5  37 - 80 %G   RBC 4.60  4.04 - 5.48 M/uL   Hemoglobin 14.4  12.2 - 16.2 g/dL   HCT, POC 96.0  45.4 - 47.9 %   MCV 97.9 (*) 80 - 97 fL   MCH, POC 31.3 (*) 27 - 31.2 pg   MCHC 32.0  31.8 - 35.4 g/dL   RDW, POC 09.8     Platelet Count, POC 254  142 - 424 K/uL   MPV 10.2  0 - 99.8 fL  POCT RAPID STREP A (OFFICE)      Result Value Range   Rapid Strep A Screen Negative  Negative     EKG/XRAY:   Primary read interpreted by Dr. Conley Rolls at [facility].   ASSESSMENT/PLAN: Encounter Diagnoses  Name Primary?  . Acute pharyngitis Yes  . Fever, unspecified   . Post-nasal drip   . Acute sinusitis   . Other malaise and fatigue    Throat cx pending Rx Flonase and Medrol dose pack If no improvement then take Z pack F/u prn  Gross sideeffects, risk and benefits, and alternatives of medications d/w patient. Patient is aware that all medications have potential sideeffects and we are unable to predict every sideeffect or drug-drug interaction that may occur.  Ananda Sitzer PHUONG, DO 03/06/2013 1:56 PM

## 2013-03-08 LAB — CULTURE, GROUP A STREP: Organism ID, Bacteria: NORMAL

## 2013-06-14 ENCOUNTER — Encounter (HOSPITAL_COMMUNITY): Payer: Self-pay | Admitting: *Deleted

## 2013-06-14 ENCOUNTER — Inpatient Hospital Stay (HOSPITAL_COMMUNITY)
Admission: AD | Admit: 2013-06-14 | Discharge: 2013-06-21 | DRG: 885 | Disposition: A | Discharge status: Home / Self Care | Payer: BC Managed Care – PPO | Attending: Psychiatry | Admitting: Psychiatry

## 2013-06-14 DIAGNOSIS — G43909 Migraine, unspecified, not intractable, without status migrainosus: Secondary | ICD-10-CM | POA: Diagnosis present

## 2013-06-14 DIAGNOSIS — F4310 Post-traumatic stress disorder, unspecified: Secondary | ICD-10-CM | POA: Diagnosis present

## 2013-06-14 DIAGNOSIS — F411 Generalized anxiety disorder: Secondary | ICD-10-CM | POA: Diagnosis present

## 2013-06-14 DIAGNOSIS — F3130 Bipolar disorder, current episode depressed, mild or moderate severity, unspecified: Principal | ICD-10-CM | POA: Diagnosis present

## 2013-06-14 DIAGNOSIS — R45851 Suicidal ideations: Secondary | ICD-10-CM

## 2013-06-14 DIAGNOSIS — F329 Major depressive disorder, single episode, unspecified: Secondary | ICD-10-CM | POA: Diagnosis present

## 2013-06-14 DIAGNOSIS — F339 Major depressive disorder, recurrent, unspecified: Secondary | ICD-10-CM

## 2013-06-14 DIAGNOSIS — F319 Bipolar disorder, unspecified: Secondary | ICD-10-CM | POA: Diagnosis present

## 2013-06-14 MED ORDER — MAGNESIUM HYDROXIDE 400 MG/5ML PO SUSP
30.0000 mL | Freq: Every day | ORAL | Status: DC | PRN
  Administered 2013-06-17: 30 mL via ORAL

## 2013-06-14 MED ORDER — ALUM & MAG HYDROXIDE-SIMETH 200-200-20 MG/5ML PO SUSP: 30.0000 mL | ORAL | Status: DC | PRN

## 2013-06-14 MED ORDER — ACETAMINOPHEN 325 MG PO TABS: 650.0000 mg | ORAL_TABLET | Freq: Four times a day (QID) | ORAL | Status: DC | PRN
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
## 2013-06-14 NOTE — Progress Notes (Signed)
Recreation Therapy Notes  Date: 02.20.2015 Time: 2:45pm Location: [address] Dayroom   Group Topic: Communication, Team Building, Problem Solving  Goal Area(s) Addresses:  Patient will effectively work with peer towards shared goal.  Patient will identify skill used to make activity successful.  Patient will identify how skills used during activity can be used to reach post d/c goals.   Behavioral Response: Did not attend.   Antwan Pandya L Belton Peplinski, LRT/CTRS  [employer] L 06/14/2013 4:21 PM 

## 2013-06-14 NOTE — Tx Team (Signed)
Initial Interdisciplinary Treatment Plan  PATIENT STRENGTHS: (choose at least two) Ability for insight Average or above average intelligence Capable of independent living  PATIENT STRESSORS: Traumatic event   PROBLEM LIST: Problem List/Patient Goals Date to be addressed Date deferred Reason deferred Estimated date of resolution  Depression 06/14/13     Suicidal Thoughts 06/14/13                                                DISCHARGE CRITERIA:  Ability to meet basic life and health needs Improved stabilization in mood, thinking, and/or behavior Verbal commitment to aftercare and medication compliance  PRELIMINARY DISCHARGE PLAN: Attend aftercare/continuing care group Return to previous living arrangement  PATIENT/FAMIILY INVOLVEMENT: This treatment plan has been presented to and reviewed with the patient, Katelyn [name], and/or family member, .  The patient and family have been given the opportunity to ask questions and make suggestions.  Katelyn [name], SaxtonBrook [name] 06/14/2013, 5:25 PM
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
## 2013-06-14 NOTE — Progress Notes (Signed)
[facility] Group Notes:  (Nursing/MHT/Case Management/Adjunct)  Date:  06/14/2013  Time:  11:52 PM  Type of Therapy:  Group Therapy  Participation Level:  Minimal  Participation Quality:  Appropriate  Affect:  Appropriate  Cognitive:  Appropriate  Insight:  Improving  Engagement in Group:  Developing/Improving  Modes of Intervention:  Orientation, Socialization and Support  Summary of Progress/Problems: Pt. Is a new admit.  Pt. Stated she was seeking help to prevent attempts of suicide.  Sondra ComeWilson, Azura Tufaro J 06/14/2013, 11:52 PM

## 2013-06-15 ENCOUNTER — Encounter (HOSPITAL_COMMUNITY): Payer: Self-pay | Admitting: Psychiatry

## 2013-06-15 DIAGNOSIS — F339 Major depressive disorder, recurrent, unspecified: Secondary | ICD-10-CM

## 2013-06-15 DIAGNOSIS — F411 Generalized anxiety disorder: Secondary | ICD-10-CM

## 2013-06-15 DIAGNOSIS — F319 Bipolar disorder, unspecified: Secondary | ICD-10-CM | POA: Diagnosis present

## 2013-06-15 DIAGNOSIS — F4310 Post-traumatic stress disorder, unspecified: Secondary | ICD-10-CM

## 2013-06-15 MED ORDER — VENLAFAXINE HCL ER 150 MG PO CP24
150.0000 mg | ORAL_CAPSULE | Freq: Every day | ORAL | Status: DC
  Administered 2013-06-15 – 2013-06-19 (×5): 150 mg via ORAL
  Filled 2013-06-15 (×8): qty 1

## 2013-06-15 MED ORDER — TRAZODONE HCL 50 MG PO TABS
50.0000 mg | ORAL_TABLET | Freq: Every day | ORAL | Status: DC
  Administered 2013-06-15 – 2013-06-20 (×6): 50 mg via ORAL
  Filled 2013-06-15 (×10): qty 1

## 2013-06-15 MED ORDER — TOPIRAMATE 25 MG PO TABS
50.0000 mg | ORAL_TABLET | Freq: Every day | ORAL | Status: DC
  Administered 2013-06-15: 50 mg via ORAL
  Filled 2013-06-15 (×4): qty 2

## 2013-06-15 MED ORDER — LAMOTRIGINE 25 MG PO TABS
25.0000 mg | ORAL_TABLET | Freq: Every morning | ORAL | Status: DC
  Administered 2013-06-15 – 2013-06-21 (×7): 25 mg via ORAL
  Filled 2013-06-15 (×11): qty 1

## 2013-06-15 MED ORDER — ALPRAZOLAM ER 0.5 MG PO TB24
1.0000 mg | ORAL_TABLET | Freq: Every day | ORAL | Status: DC
  Administered 2013-06-15 – 2013-06-21 (×7): 1 mg via ORAL
  Filled 2013-06-15 (×7): qty 2

## 2013-06-15 MED ORDER — NORGESTIMATE-ETH ESTRADIOL 0.25-35 MG-MCG PO TABS
1.0000 | ORAL_TABLET | Freq: Every day | ORAL | Status: DC
  Administered 2013-06-15: 1 via ORAL

## 2013-06-15 MED ORDER — ARIPIPRAZOLE 2 MG PO TABS
2.0000 mg | ORAL_TABLET | Freq: Every day | ORAL | Status: DC
  Administered 2013-06-15 – 2013-06-17 (×3): 2 mg via ORAL
  Filled 2013-06-15 (×6): qty 1

## 2013-06-15 MED ORDER — SUMATRIPTAN SUCCINATE 100 MG PO TABS
100.0000 mg | ORAL_TABLET | ORAL | Status: DC | PRN
  Administered 2013-06-15 – 2013-06-18 (×4): 100 mg via ORAL
  Filled 2013-06-15 (×4): qty 2

## 2013-06-15 NOTE — Progress Notes (Signed)
Adult Psychoeducational Group Note  Date:  06/15/2013 Time:  9:55 PM  Group Topic/Focus:  Wrap-Up Group:   The focus of this group is to help patients review their daily goal of treatment and discuss progress on daily workbooks.  Participation Level:  Active  Participation Quality:  Appropriate  Affect:  Appropriate  Cognitive:  Appropriate  Insight: Appropriate  Engagement in Group:  Engaged  Modes of Intervention:  Discussion  Additional Comments:  The patient expressed that cost of bad bahavior is not worth it.  Octavio Mannshigpen, Otilia Kareem Lee 06/15/2013, 9:55 PM

## 2013-06-15 NOTE — Progress Notes (Signed)
D) Pt rates her depression at an 8 and her hopelessness at a 5. Has thoughts of SI on and off. States that she is feeling safe here. Has attended the groups and interacts with her peers. Talks about Recovery as her journey and she is happy to be on that road. Verbalized that she feels a little awkward about being here, but is aware that she truly needed to be here for her safety. Stated  "when I walked in through the doors I made a commitment to myself to not hide who I was". A) Given support, praise and reassurance. Encouraged to participate fully in the groups. R) Has participated fully in the program and is seeking recovery for herself
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
## 2013-06-15 NOTE — [facility] Group Notes (Signed)
[facility] Group Notes:  (Clinical Social Work)  06/15/2013   1:15-2:15PM  Summary of Progress/Problems:   The main focus of today's process group was for the patient to identify ways in which they have sabotaged their own mental health wellness/recovery.  Motivational interviewing and a handout were used to explore the benefits and costs of their self-sabotaging behavior as well as the benefits and costs of changing this behavior.  The Stages of Change were explained to the group using a handout, and patients identified where they are with regard to changing self-defeating behaviors.  The patient expressed she self-sabotages with denying her feelings.  She showed considerable insight and thoughtfulness into filling out the handout about the benefits/costs of denying her feelings versus the benefits/costs of changing this behavior.  Type of Therapy:  Process Group  Participation Level:  Active  Participation Quality:  Attentive, Sharing and Supportive  Affect:  Blunted and Depressed  Cognitive:  Alert, Appropriate and Oriented  Insight:  Engaged  Engagement in Therapy:  Engaged  Modes of Intervention:  Education, Motivational Interviewing   Ambrose MantleMareida Grossman-Orr, LCSW 06/15/2013, 4:00pm

## 2013-06-15 NOTE — Progress Notes (Signed)
.  Psychoeducational Group Note    Date: 06/15/2013 Time:  0930   Goal Setting Purpose of Group: To be able to set a goal that is measurable and that can be accomplished in one day Participation Level:  Active  Participation Quality:  Appropriate  Affect:  Appropriate  Cognitive:  Oriented  Insight:  Improving  Engagement in Group:  Improving  Additional Comments:    Cressida Milford A  

## 2013-06-15 NOTE — Progress Notes (Signed)
Psychoeducational Group Note  Date: 06/15/2013 Time:  1015  Group Topic/Focus:  Identifying Needs:   The focus of this group is to help patients identify their personal needs that have been historically problematic and identify healthy behaviors to address their needs.  Participation Level:  Active  Participation Quality:  Appropriate  Affect:  Appropriate  Cognitive:  Oriented  Insight:  Improving  Engagement in Group:  Engaged  Additional Comments:  Attended and participated in the group  Haydin Dunn A  

## 2013-06-15 NOTE — [facility] Suicide Risk Assessment (Signed)
Suicide Risk Assessment  Admission Assessment     Nursing information obtained from:    Demographic factors:    Current Mental Status:    Loss Factors:    Historical Factors:    Risk Reduction Factors:    Total Time spent with patient: 1 hour  CLINICAL FACTORS:   Bipolar Disorder:   Depressive phase More than one psychiatric diagnosis  Psychiatric Specialty Exam:     Blood pressure 96/63, pulse 93, temperature 97.6 F (36.4 C), resp. rate 18, height 5\' 2"  (1.575 m), weight 83.915 kg (185 lb).Body mass index is 33.83 kg/(m^2).  General Appearance: Fairly Groomed  Patent attorneyye Contact::  Fair  Speech:  Clear and Coherent, Slow and not spontaneous  Volume:  Decreased  Mood:  Anxious, Depressed and worried  Affect:  sad, anxious, worried, teary eyed  Thought Process:  Coherent and Goal Directed  Orientation:  Full (Time, Place, and Person)  Thought Content:  symptoms, worries, concerns. Gives a history suggestive of a Bipolar Disorder with drastic mood changes with episodes of increase energy, racing thoughts, impulsive behavior, hyper sexuality behavior out of character for her  Suicidal Thoughts:  Yes.  without intent/plan  Homicidal Thoughts:  No  Memory:  Immediate;   Fair Recent;   Fair Remote;   Fair  Judgement:  Fair  Insight:  Present  Psychomotor Activity:  Restlessness  Concentration:  Fair  Recall:  [employer]Fair  Fund of Knowledge:Fair  Language: Fair  Akathisia:  No  Handed:    AIMS (if indicated):     Assets:  Desire for Improvement Housing Social Support Transportation Vocational/Educational  Sleep:  Number of Hours: 6.25   Musculoskeletal: Strength & Muscle Tone: within normal limits Gait & Station: normal Patient leans: N/A  COGNITIVE FEATURES THAT CONTRIBUTE TO RISK:  Closed-mindedness Polarized thinking Thought constriction (tunnel vision)    SUICIDE RISK:   Moderate:  Frequent suicidal ideation with limited intensity, and duration, some specificity in  terms of plans, no associated intent, good self-control, limited dysphoria/symptomatology, some risk factors present, and identifiable protective factors, including available and accessible social support.  PLAN OF CARE: Supportive approach/coping skills                               Will continue the Effexor at 225 mg and add Abilify and Lamictal.                                CBT;mindfulness   I certify that inpatient services furnished can reasonably be expected to improve the patient's condition.  Firman Petrow A 06/15/2013, 1:54 PM

## 2013-06-16 DIAGNOSIS — F332 Major depressive disorder, recurrent severe without psychotic features: Secondary | ICD-10-CM

## 2013-06-16 MED ORDER — NORGESTIMATE-ETH ESTRADIOL 0.25-35 MG-MCG PO TABS
1.0000 | ORAL_TABLET | Freq: Every day | ORAL | Status: DC
  Administered 2013-06-16 – 2013-06-20 (×5): 1 via ORAL

## 2013-06-16 MED ORDER — TOPIRAMATE 25 MG PO TABS
50.0000 mg | ORAL_TABLET | Freq: Every day | ORAL | Status: DC
  Administered 2013-06-16 – 2013-06-20 (×5): 50 mg via ORAL
  Filled 2013-06-16 (×8): qty 2
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
## 2013-06-16 NOTE — [facility] Group Notes (Signed)
[facility] Group Notes:  (Clinical Social Work)  06/16/2013   1:15-2:15PM  Summary of Progress/Problems:  The main focus of today's process group was to   identify the patient's current support system and decide on other supports that can be put in place.  The picture on workbook was used to discuss why additional supports are needed.  An emphasis was placed on using counselor, doctor, therapy groups, 12-step groups, and problem-specific support groups to expand supports.   There was also an extensive discussion about what constitutes a healthy support versus an unhealthy support.  The patient expressed full comprehension of the concepts presented, and agreed that there is a need to add more supports.  The patient stated the unhealthy support in her life was her parents, and that she has had to cut them out of her life.  This was part of why she ended up in the hospital.  She also talked about attending a support group and educated other group members about what to expect from such.  Type of Therapy:  Process Group  Participation Level:  Active  Participation Quality:  Attentive and Sharing and Supportive  Affect:  Blunted and Depressed  Cognitive:  Appropriate and Oriented  Insight:  Engaged  Engagement in Therapy:  Engaged  Modes of Intervention:  Education,  Support and ConAg[employer] FoodsProcessing  Mozetta Murfin Grossman-Orr, LCSW 06/16/2013, 4:00pm

## 2013-06-16 NOTE — Progress Notes (Signed)
D) Pt has attended the groups and interacts with her peers appropriately. Rates her depression at an 8 and her hopelessness at a 4. Denies SI and HI. Pt. Continues to work on her issues and to reach out for understanding and peace. Pt requested a 1:1.  A) Given support and reassurance along with praise. Provided with a 1:1. Encouragement given \\R ) Denies SI and HI.

## 2013-06-16 NOTE — Progress Notes (Signed)
Psychoeducational Group Note  Date:  06/16/2013 Time:  1015  Group Topic/Focus:  Making Healthy Choices:   The focus of this group is to help patients identify negative/unhealthy choices they were using prior to admission and identify positive/healthier coping strategies to replace them upon discharge.  Participation Level:  Active  Participation Quality:  Appropriate  Affect:  appropriate Cognitive:  Oriented  Insight:  Improving  Engagement in Group:  Engaged  Additional Comments:   Britney Newstrom A 06/16/2013  
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
## 2013-06-16 NOTE — Progress Notes (Signed)
Psychoeducational Group Note  Date: 06/16/2013 Time:  0930  Group Topic/Focus:  Gratefulness:  The focus of this group is to help patients identify what two things they are most grateful for in their lives. What helps ground them and to center them on their work to their recovery.  Participation Level:  Active  Participation Quality:  Appropriate  Affect:  Appropriate  Cognitive:  Oriented  Insight:  Improving  Engagement in Group:  Engaged  Additional Comments:  Participated fully in the group  Katelyn [name] A   

## 2013-06-16 NOTE — Progress Notes (Signed)
D:  Pt denies suicidal ideation, HI/AH/VH A:  Pt states she completed goal set in the morning meeting.  She reports wanting to "use her time wisely" while in the hospital R:  Pt remains safe, being monitored every 15 minutes per order, she is committed to the her recovery

## 2013-06-16 NOTE — Progress Notes (Signed)
Adult Psychoeducational Group Note  Date:  06/16/2013 Time:  3:05PM  Group Topic/Focus:  Making Healthy Choices:   The focus of this group is to help patients identify negative/unhealthy choices they were using prior to admission and identify positive/healthier coping strategies to replace them upon discharge.  Participation Level:  Active  Participation Quality:  Appropriate  Affect:  Appropriate  Cognitive:  Appropriate  Insight: Appropriate  Engagement in Group:  Engaged  Modes of Intervention:  Discussion  Additional Comments:  Pt was attentive throughout group   Jaaziel Peatross K 06/16/2013, 3:22 PM

## 2013-06-16 NOTE — Progress Notes (Signed)
[facility]  06/16/2013 1:44 PM Katelyn [name]  MRN:  [id number] Subjective:  Patient is resting in bed with and ice pack to neck c/o "migrane headache'.  She reports increase episodes of migrane with increased stressors.  She has mediations in place for effective treatment of this migraine at this time. She again expresses gratitude and knowledge that this is exactly where she needs to be to "get back on track and addresses the issues".  Her appetite is fair, experiencing some nausea from Lamictal  and sleep is fair She rates Depression 7/10 Anxiety 3/10.  Today denies Suicidal thoughts or ideations presently   Diagnosis:   DSM5: Trauma-Stressor Disorders:  Posttraumatic Stress Disorder (309.81) Depressive Disorders:  Major Depressive Disorder - Severe (296.23) Total Time spent with patient: 30 minutes  Axis I: Major Depression, Recurrent severe and Post Traumatic Stress Disorder Axis II: Deferred Axis III:  Past Medical History  Diagnosis Date  . Arthritis   . Migraine headache   . Depression   . Anxiety   . PTSD (post-traumatic stress disorder)    Axis IV: other psychosocial or environmental problems and problems related to social environment Axis V: 31-40 impairment in reality testing  ADL's:  Intact  Sleep: Fair  Appetite:  Fair  Suicidal Ideation:  -denies presently Homicidal Ideation:  deniesAEB (as evidenced by):  Psychiatric Specialty Exam: Physical Exam  Constitutional: She is oriented to person, place, and time. She appears well-developed and well-nourished.  HENT:  Head: Normocephalic and atraumatic.  Neck: Normal range of motion. Neck supple.  Respiratory: Effort normal.  Musculoskeletal: Normal range of motion.  Neurological: She is alert and oriented to person, place, and time.  Skin: Skin is warm and dry.    ROS  Blood pressure 100/67, pulse 90, temperature 98.1 F (36.7 C), temperature source Oral, resp. rate 16, height 5\' 2"  (1.575  m), weight 83.915 kg (185 lb).Body mass index is 33.83 kg/(m^2).  General Appearance: Casual  Eye Contact::  Fair  Speech:  Slow  Volume:  Decreased  Mood:  Depressed  Affect:  Depressed  Thought Process:  Negative  Orientation:  Full (Time, Place, and Person)  Thought Content:  Negative  Suicidal Thoughts:  No  Homicidal Thoughts:  No  Memory:  Immediate;   Fair Recent;   Fair Remote;   Fair  Judgement:  Fair  Insight:  Fair  Psychomotor Activity:  Decreased  Concentration:  Fair  Recall:  FiservFair  Fund of Knowledge:Fair  Language: Fair  Akathisia:  No  Handed:  Right  AIMS (if indicated):     Assets:  Communication Skills Desire for Improvement Resilience Social Support Talents/Skills  Sleep:  Number of Hours: 6.75   Musculoskeletal: Strength & Muscle Tone: within normal limits Gait & Station: normal Patient leans: N/A  Current Medications: Current Facility-Administered Medications  Medication Dose Route Frequency Provider Last Rate Last Dose  . acetaminophen (TYLENOL) tablet 650 mg  650 mg Oral Q6H PRN Nehemiah SettleJanardhaha R Jonnalagadda, MD      . ALPRAZolam (XANAX XR) 24 hr tablet 1 mg  1 mg Oral Daily Canary BrimMary W Larach, NP   1 mg at 06/16/13 [id number]  . alum & mag hydroxide-simeth (MAALOX/MYLANTA) 200-200-20 MG/5ML suspension 30 mL  30 mL Oral Q4H PRN Nehemiah SettleJanardhaha R Jonnalagadda, MD      . ARIPiprazole (ABILIFY) tablet 2 mg  2 mg Oral Daily Canary BrimMary W Larach, NP   2 mg at 06/16/13 [id number]  . lamoTRIgine (LAMICTAL) tablet 25 mg  25 mg Oral q morning - 10a Canary Brim, NP   25 mg at 06/16/13 9604  . magnesium hydroxide (MILK OF MAGNESIA) suspension 30 mL  30 mL Oral Daily PRN Nehemiah Settle, MD      . norgestimate-ethinyl estradiol (ORTHO-CYCLEN,SPRINTEC,PREVIFEM) 0.25-35 MG-MCG tablet 1 tablet  1 tablet Oral QHS Nehemiah Settle, MD      . SUMAtriptan (IMITREX) tablet 100 mg  100 mg Oral PRN Canary Brim, NP   100 mg at 06/16/13 1257  . topiramate (TOPAMAX) tablet 50 mg   50 mg Oral QHS Nehemiah Settle, MD      . traZODone (DESYREL) tablet 50 mg  50 mg Oral QHS Canary Brim, NP   50 mg at 06/15/13 2117  . venlafaxine XR (EFFEXOR-XR) 24 hr capsule 150 mg  150 mg Oral Daily Canary Brim, NP   150 mg at 06/16/13 5409    Lab Results: No results found for this or any previous visit (from the past 48 hour(s)).  Physical Findings: AIMS: Facial and Oral Movements Muscles of Facial Expression: None, normal Lips and Perioral Area: None, normal Jaw: None, normal Tongue: None, normal,Extremity Movements Upper (arms, wrists, hands, fingers): None, normal Lower (legs, knees, ankles, toes): None, normal, Trunk Movements Neck, shoulders, hips: None, normal, Overall Severity Severity of abnormal movements (highest score from questions above): None, normal Incapacitation due to abnormal movements: None, normal Patient's awareness of abnormal movements (rate only patient's report): No Awareness, Dental Status Current problems with teeth and/or dentures?: No Does patient usually wear dentures?: No  CIWA:    COWS:     Treatment Plan Summary: Daily contact with patient to assess and evaluate symptoms and progress in treatment Medication management   Review of chart, vital signs, medications and notes  Plan: 1. Continue crisis management and stabilization. Estimated length of stay 5-7 days  2.  Medication management to reduce current symptoms to base line and improve patient's overall level of functioning     Medications reviewed with the apteint and no untoward effects        Lamictal is causing some nausea, "I can deal with it", expectation is for nausea to lessen       Individual and group therapy encouraged      Coping skills for depression, substance abuse, and anxiety 3.  Treat health problems as indicated. 4.  Develop treatment plan to decrease risk of relapse upon discharge and the need for readmission 5.  Psych-social education regarding relapse  prevention and self care. 6.  Health care follow up as needed for medical problems 7.  Continue home medications where appropriate 8.  Disposition in progress  Plan:  Medical Decision Making Problem Points:  Established problem, stable/improving (1) and Review of psycho-social stressors (1) Data Points:  Review of medication regiment & side effects (2) Review of new medications or change in dosage (2)  I certify that inpatient services furnished can reasonably be expected to improve the patient's condition.   Lorinda Creed  PMHNP 06/16/2013, 1:44 PM Agree with assessment and plan Madie Reno A. Dub Mikes, M.D.

## 2013-06-17 MED ORDER — ARIPIPRAZOLE 5 MG PO TABS
5.0000 mg | ORAL_TABLET | Freq: Every day | ORAL | Status: DC
  Administered 2013-06-18 – 2013-06-21 (×4): 5 mg via ORAL
  Filled 2013-06-17 (×7): qty 1

## 2013-06-17 NOTE — Progress Notes (Signed)
D: Pt denies SI/HI/AVH. Pt is pleasant and cooperative. Pt stated she's feeling better, "I'm glad I'm here, I didn't want to die. I lost a good friend who took her life a month ago" Pt stated she was constipated since Friday , and was given milk of mag / Prune juice.    A: Pt was offered support and encouragement. Pt was given scheduled medications. Pt was encourage to attend groups. Q 15 minute checks were done for safety.   R:Pt attends groups and interacts well with peers and staff. Pt is taking medication.Pt receptive to treatment and safety maintained on unit.

## 2013-06-17 NOTE — [facility] Group Notes (Signed)
[facility]   06/17/2013 12:48 PM    Participation Quality:  Appropraite  Mood/Affect:  Appropriate  Depression Rating:  7  Anxiety Rating:  2  Thoughts of Suicide:  No  Will you contract for safety?   NA  Current AVH:  No  Plan for Discharge/Comments:  Patient attended discharge planning group and actively participated in group.  She reports having home.  She has outpatient services with Dr. Blanchard Manehrotakura and Jeanella FlatterySarah Young. CSW provided all participants with daily workbook.   Transportation Means: Patient has transportation.   Supports:  Patient has a support system.   Oria Klimas, Joesph JulyQuylle Hairston
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
## 2013-06-17 NOTE — Progress Notes (Signed)
Adult Psychoeducational Group Note  Date:  06/17/2013 Time:  10:29 PM  Group Topic/Focus:  Wrap-Up Group:   The focus of this group is to help patients review their daily goal of treatment and discuss progress on daily workbooks.  Participation Level:  Active  Participation Quality:  Appropriate  Affect:  Appropriate  Cognitive:  Appropriate  Insight: Appropriate  Engagement in Group:  Engaged  Modes of Intervention:  Support  Additional Comments:  Pt stated that one good thing that happened today was that she was able to let herself fall apart in the grief session and that it was really good for her. Pt was given support.   Quentavious Rittenhouse 06/17/2013, 10:29 PM
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
## 2013-06-17 NOTE — Progress Notes (Signed)
Patient ID: Radene JourneyChrista [name], female   DOB: 12/28/1970, 43 y.o.   MRN: [id number] D-Patient reports she slept well and her appetite is improving.  Her energy level is low and her ability to pay attention is improving.  She is rating her depression at 7/10 and her hopelessness at 4/10 She denies any thoughts of self harm.  She is rating her anxiety at 2/10.  She has been having migraine headaches for weeks and this am took imitrex early to try to interrupt the pain and says her headache is only 2/10 now. A- Supported  Patient.  R- Says she knows she needs to ask for more support  and do a better job of expressing her needs.

## 2013-06-17 NOTE — [facility] Group Notes (Signed)
[facility] LCSW Group Therapy          Overcoming Obstacles       1:15 -2:30        06/17/2013       Type of Therapy:  Group Therapy  Participation Level:  Appropriate  Participation Quality:  Appropriate  Affect:  Appropriate, Alert  Cognitive:  Attentive Appropriate  Insight: Developing/Improving Engaged  Engagement in Therapy: Developing/Imprvoing Engaged  Modes of Intervention:  Discussion Exploration  Education Rapport BuildingProblem-Solving Support  Summary of Progress/Problems:  The main focus of today's group was overcoming obstacles.   She advised the obstacle she has to overcome is finances.  Patient shared she does not want to return to work immediately and has to find a way to handle her finances if she works part time for a while.  Patient able to identify appropriate coping skills.   Katelyn [name], Katelyn [name] 06/17/2013

## 2013-06-18 DIAGNOSIS — F329 Major depressive disorder, single episode, unspecified: Secondary | ICD-10-CM

## 2013-06-18 NOTE — [facility] Group Notes (Signed)
[facility] LCSW Group Therapy      Feelings About Diagnosis 1:15 - 2:30 PM         06/18/2013  2:59 PM    Type of Therapy:  Group Therapy  Participation Level:  Active  Participation Quality:  Appropriate  Affect:  Appropriate  Cognitive:  Alert and Appropriate  Insight:  Developing/Improving and Engaged  Engagement in Therapy:  Developing/Improving and Engaged  Modes of Intervention:  Discussion, Education, Exploration, Problem-Solving, Rapport Building, Support  Summary of Progress/Problems:  Patient actively participated in group. Patient discussed past and present diagnosis and the effects it has had on  life.  Patient talked about family and society being judgmental and the stigma associated with having a mental health diagnosis.  She shared she was initially ashamed of her diagnosis but has learned it is a label that helps the MD to know how to work with medications.  Wynn BankerHodnett, Kaysa Roulhac Hairston 06/18/2013  2:59 PM

## 2013-06-18 NOTE — [facility] Suicide Risk Assessment (Signed)
[facility] INPATIENT:  Family/Significant Other Suicide Prevention Education  Suicide Prevention Education:  Education Completed; Katelyn [name], Friend, [phone]; has been identified by the patient as the family member/significant other with whom the patient will be residing, and identified as the person(s) who will aid the patient in the event of a mental health crisis (suicidal ideations/suicide attempt).  With written consent from the patient, the family member/significant other has been provided the following suicide prevention education, prior to the and/or following the discharge of the patient.  The suicide prevention education provided includes the following:  Suicide risk factors  Suicide prevention and interventions  National Suicide Hotline telephone number  [facility] assessment telephone number  [facility] Emergency Assistance 911  [facility] and/or [facility] Crisis [facility] telephone number  Request made of family/significant other to:  Remove weapons (e.g., guns, rifles, knives), all items previously/currently identified as safety concern.  Friend advised patient does not have access to guns.  Remove drugs/medications (over-the-counter, prescriptions, illicit drugs), all items previously/currently identified as a safety concern.  The family member/significant other verbalizes understanding of the suicide prevention education information provided.  The family member/significant other agrees to remove the items of safety concern listed above.  Wynn BankerHodnett, Ronrico Dupin Hairston 06/18/2013, 12:38 PM

## 2013-06-18 NOTE — Progress Notes (Addendum)
The focus of this group is to educate the patient on the purpose and policies of crisis stabilization and provide a format to answer questions about their admission.  The group details unit policies and expectations of patients while admitted.  Patient attended 0900 nurse education orientation group this morning.  Patient actively participated, appropriate affect, alert, appropriate insight and engagement.  Today patient will work on 3 goals for discharge.  

## 2013-06-18 NOTE — Progress Notes (Signed)
Recreation Therapy Notes   Date: 02.23.2015 Time: 2:45pm Location: 500 [address] Dayroom   Group Topic: Wellness  Goal Area(s) Addresses:  Patient will define components of whole wellness. Patient will verbalize benefit of whole wellness.  Behavioral Response: Engaged, Appropriate   Intervention: Mind Map  Activity: Patients were asked to identify and define dimensions of wellness - physical, mental, emotional, spiritual, leisure, financial, environmental, and intellectual. Patients were then asked to identify activities they can participate in to invest in each dimension of wellness.   Education: Wellness, Discharge Planning   Education Outcome: Acknowledges understanding   Clinical Observations/Feedback: Patient actively engaged in group activity, identifying and defining dimensions of wellness. Patient was asked to leave session early to meet with MD, patient did not return to group session.    Marykay Lexenise L Lile Mccurley, LRT/CTRS  Rik Wadel L 06/18/2013 9:58 AM
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
## 2013-06-18 NOTE — Progress Notes (Addendum)
D:  Patient's self inventory sheet, patient has fair sleep,  Improving appetite, low energy level, improving attention span.  Rated depression 5, hopeless 2.  Denied withdrawals.  Denied SI.  Has experienced headache in past 24 hours.  Worst pain #2.  After discharge, plans to exercise, swim, get more help with son, express needs/ ask for help; attend a support group.   No problems taking meds after discharge. A:  Medications administered per MD orders.  Emotional support and encouragement given patient. R:  Denied SI and HI.   Denied A/V hallucinations.  Will continue to monitor patient for safety with 15 minute checks.  Safety maintained.

## 2013-06-19 MED ORDER — VENLAFAXINE HCL ER 75 MG PO CP24
225.0000 mg | ORAL_CAPSULE | Freq: Every day | ORAL | Status: DC
  Administered 2013-06-20 – 2013-06-21 (×2): 225 mg via ORAL
  Filled 2013-06-19 (×4): qty 1

## 2013-06-19 NOTE — Tx Team (Signed)
Interdisciplinary Treatment Plan Update   Date Reviewed:  06/19/2013  Time Reviewed:  11:04 AM  Progress in Treatment:   Attending groups: Yes Participating in groups: Yes Taking medication as prescribed: Yes  Tolerating medication: Yes Family/Significant other contact made:  No, collateral contact made with friend. Patient understands diagnosis: Yes  Discussing patient identified problems/goals with staff: Yes Medical problems stabilized or resolved: Yes Denies suicidal/homicidal ideation: Yes Patient has not harmed self or others: Yes  For review of initial/current patient goals, please see plan of care.  Estimated Length of Stay:  2-3 days  Reasons for Continued Hospitalization:  Anxiety Depression Medication stabilization  New Problems/Goals identified:    Discharge Plan or Barriers:   Home with outpatient follow up to be determined  Additional Comments:  Continue medication stabilization.    Attendees:  Patient:  06/19/2013 11:04 AM   Signature: Mervyn GayJ. Jonnalagadda, MD 06/19/2013 11:04 AM  Signature:   06/19/2013 11:04 AM  Signature:  Claudette Headonrad Withrow, NP 06/19/2013 11:04 AM  Signature:  Harold Barbanonecia Byrd, RN 06/19/2013 11:04 AM  Signature:   06/19/2013 11:04 AM  Signature:  Juline PatchQuylle Jayden Kratochvil, LCSW 06/19/2013 11:04 AM  Signature:  Reyes Ivanhelsea [name], LCSW 06/19/2013 11:04 AM  Signature:  Leisa LenzValerie Enoch, Care Coordinator [facility] 06/19/2013 11:04 AM  Signature:  Marzetta Boardhrista Dopson, RN 06/19/2013 11:04 AM  Signature:  06/19/2013  11:04 AM  Signature:   Onnie BoerJennifer Clark, RN [facility] 06/19/2013  11:04 AM  Signature:   06/19/2013  11:04 AM    Scribe for Treatment Team:   Juline PatchQuylle Makaiya Geerdes,  06/19/2013 11:04 AM

## 2013-06-19 NOTE — Progress Notes (Signed)
Adult Psychoeducational Group Note  Date:  06/19/2013 Time:  10:00am Group Topic/Focus:  Personal Choices and Values:   The focus of this group is to help patients assess and explore the importance of values in their lives, how their values affect their decisions, how they express their values and what opposes their expression.  Participation Level:  Active  Participation Quality:  Appropriate and Attentive  Affect:  Appropriate  Cognitive:  Alert and Appropriate  Insight: Appropriate  Engagement in Group:  Engaged  Modes of Intervention:  Discussion and Education  Additional Comments:  Pt attended and participated in group. Discussion was on personal development. Pt was asked what does personal development mean to you? Pt stated personal development means looking at all aspects of who you are and grow in each these areas.  Shelly BombardGarner, Iyanla Eilers D 06/19/2013, 2:16 PM

## 2013-06-19 NOTE — [facility] Group Notes (Signed)
[facility]   06/19/2013 10:57 AM    Participation Quality:  Appropraite  Mood/Affect:  Appropriate  Depression Rating:  7  Anxiety Rating:  4  Thoughts of Suicide:  No  Will you contract for safety?   NA  Current AVH:  No  Plan for Discharge/Comments:  Patient attended discharge planning group and actively participated in group.  She will return to her home and will follow up with [facility].  CSW provided all participants with daily workbook.   Transportation Means: Patient has transportation.   Supports:  Patient has a support system.   Viridiana Spaid, Joesph JulyQuylle Hairston

## 2013-06-19 NOTE — Progress Notes (Signed)
[facility] Group Notes:  (Nursing/MHT/Case Management/Adjunct)  Date:  06/19/2013  Time:  12:50 AM  Type of Therapy:  Group Therapy  Participation Level:  Active  Participation Quality:  Appropriate  Affect:  Appropriate  Cognitive:  Appropriate  Insight:  Appropriate  Engagement in Group:  Engaged  Modes of Intervention:  Socialization and Support  Summary of Progress/Problems: Pt. Had good insight on group topic of recovery.  Pt. Stated she uses a check list of daily activities to recognize early warning for relapse.  Sondra ComeWilson, Joab Carden J 06/19/2013, 12:50 AM

## 2013-06-19 NOTE — Progress Notes (Signed)
D: Patient cooperative with staff and peers. Patient's affect appropriate to circumstance and mood is depressed. She reported on the self inventory sheet that she's sleeping fair, appetite and ability to pay attention are both improving and energy level is low. Patient rated depression "7" and feelings of hopelessness "4". She's participating in groups and interactive with peers in the dayroom. Patient is compliant with medications.  A: Support and encouragement provided to patient. Administered scheduled medications per ordering MD. Monitor Q15 minute checks for safety.  R: Patient receptive. Denies SI/HI and auditory/visual hallucinations. Patient remains safe on the unit.

## 2013-06-19 NOTE — [facility] Group Notes (Signed)
[facility] LCSW Group Therapy  Emotional Regulation 1:15 - 2: 30 PM        06/19/2013     Type of Therapy:  Group Therapy  Participation Level:  Appropriate  Participation Quality:  Appropriate  Affect:  Appropriate  Cognitive:  Attentive Appropriate  Insight:  Developing/Improving Engaged  Engagement in Therapy:  Developing/Improving Engaged  Modes of Intervention:  Discussion Exploration Problem-Solving Supportive  Summary of Progress/Problems:  Group topic was emotional regulations.  Patient participated in the discussion and was able to identify an emotion that needed to regulated.  Patient shared she has to learn what to do with the anger she is feeling toward family for things that happened in her past. She talked how she binges on food as a way of soothing her feelings.    Patient was able to identify approprite coping skills.  Wynn BankerHodnett, Sharay Bellissimo Hairston 06/19/2013

## 2013-06-19 NOTE — Progress Notes (Signed)
Recreation Therapy Notes   Date: 02.25.2015  Time: 2:45pm Location: 500 Hall Dayroom   Group Topic: Understanding mental health   Goal Area(s) Addresses:  Patient will identify benefits to understanding mental health. Patient will work effectively with teammates.   Behavioral Response: Engaged, Attentive  Intervention: Game  Activity: Mental Health Jeopardy. Patients were divided into groups of 4 -5 patients, working as a team patients were asked to select a category of questions and a point value. Categories included: Symptoms, Medications, Causes, Coping Skills, and Substance Abuse.   Education: Wellness, Discharge Planning   Education Outcome: Acknowledges understanding  Clinical Observations/Feedback: Patient actively engaged in group activity, working well with peers. Patient assisted teammates with answering questions. Patient was asked to leave group session at approximately 3:20pm. Patient did not return to group session.    Marykay Lexenise L Nayden Czajka, LRT/CTRS  Ameir Faria L 06/19/2013 5:08 PM

## 2013-06-19 NOTE — [facility] Group Notes (Signed)
Adult Psychoeducational Group Note  Date:  06/19/2013 Time:  9:10 PM  Group Topic/Focus:  Wrap-Up Group:   The focus of this group is to help patients review their daily goal of treatment and discuss progress on daily workbooks.  Participation Level:  Active  Participation Quality:  Appropriate  Affect:  Appropriate  Cognitive:  Appropriate  Insight: Appropriate  Engagement in Group:  Engaged  Modes of Intervention:  Discussion  Additional Comments:  Katelyn [name] stated her day started off not so great, it got better after a few groups and journaling and was able to get her feelings out.  She also stated she got a surprise visit from her friend.  She said she likes to play with her son and enjoys gardening.  Katelyn [name], Katelyn [name] A 06/19/2013, 9:10 PM

## 2013-06-19 NOTE — Progress Notes (Signed)
Recreation Therapy Notes  Date: 02.24.2015 Time: 2:45pm Location: 500 Hall Dayroom   Group Topic: Communication, Team Building, Problem Solving  Goal Area(s) Addresses:  Patient will effectively work with peer towards shared goal.  Patient will identify skill used to make activity successful.  Patient will identify how skills used during activity can be used to reach post d/c goals.   Behavioral Response: Appropriate   Intervention: Problem Solving Activitiy  Activity: Life Boat. Patients were given a scenario about being on a sinking yacht. Patients were informed the yacht included 15 guest, 8 of which could be placed on the life boat, along with all group members. Individuals on guest list were of varying socioeconomic classes such as a Education officer, museumriest, Materials engineerresident Obama, MidwifeBus Driver, Tree surgeonTeacher and Chef.   Education: Pharmacist, communityocial Skills, Discharge Planning   Education Outcome: Acknowledges understanding  Clinical Observations/Feedback: Patient actively engaged in group activity, voicing her opinion and debating with peers appropriately. Patient contributed to group discussion, identifying benefit of using group skills post d/c. Patient additionally related group skills used to one another and highlighted importance of using skills in conjunction with one another.    Marykay Lexenise L Janeisha Ryle, LRT/CTRS  Christyanna Mckeon L 06/19/2013 1:18 PM
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
## 2013-06-20 NOTE — [facility] Group Notes (Signed)
[facility] LCSW Group Therapy  Living A Balanced Life  1:15 - 2: 30          06/20/2013    Type of Therapy:  Group Therapy  Participation Level:  Appropriate  Participation Quality:  Appropriate  Affect:  Appropriate  Cognitive:  Attentive Appropriate  Insight: Developing/Improving  Engagement in Therapy:  Developing/Improving  Modes of Intervention:  Discussion Exploration Problem-Solving Supportive   Summary of Progress/Problems: Topic for group was Living a Balanced Life.  Patient was able to show how life has become unbalanced.   She shared her life is out of balance due to her focus being on work and her son.    Patient able to  Identify appropriate coping skills including going to the "[facility]" to get more exercise.   Wynn BankerHodnett, Trishna Cwik Hairston 06/20/2013

## 2013-06-20 NOTE — Progress Notes (Addendum)
Patient's standing B/P earlier this morning was 82/49; BP rechecked, 101/73. Patient informed writer that her B/P normally runs low on a daily basis. Writer offered patient fluids and informed her to notify RN or other staff if she feels dizzy when walking or rising from the bed.

## 2013-06-20 NOTE — Clinical Social Work Note (Signed)
Writer met with patient who reports she is doing well today and hopes to be able to discharge home tomorrow.  She advised she plans to bring her son back home for the evening but he will be sleep over with a friend on Saturday night.  Writer asked patient if she would be okay being alone on Saturday night.  She advised her neighbors, who are both counseling, will be checking on her to make sure she is okay.

## 2013-06-20 NOTE — Progress Notes (Signed)
Patient ID: Katelyn JourneyChrista S Shouse, female   DOB: 01/24/1971, 43 y.o.   MRN: [id number]  Morning Wellness Group 9 A.M.  The focus of this group is to educate the patient on the purpose and policies of crisis stabilization and provide a format to answer questions about their admission.  The group details unit policies and expectations of patients while admitted.    Patient attended group and reports that she likes to garden and draw for her leisure activity. She remained with flat affect but reports that the group was helpful.

## 2013-06-20 NOTE — Progress Notes (Signed)
D: Patient presents with appropriate affect and depressed mood. She reported on the self inventory sheet that she's sleeping well, appetite and ability to pay attention are both good and energy level is normal. Patient rated depression and feelings of hopelessness "2". Continues to attend groups and tolerated medications well.  A: Support and encouragement provided to patient. Scheduled medications administered per MD orders. Maintain Q15 minute checks for safety.  R: Patient receptive. Denies SI/HI/AVH. Patient remains safe.

## 2013-06-20 NOTE — Progress Notes (Signed)
Adult Psychoeducational Group Note  Date:  06/20/2013 Time: 8:30 PM  Group Topic/Focus:  Wrap-Up Group:   The focus of this group is to help patients review their daily goal of treatment and discuss progress on daily workbooks.  Participation Level:  Minimal  Participation Quality:  Appropriate and Attentive  Affect:  Flat  Cognitive:  Alert and Appropriate  Insight: Appropriate and Good  Engagement in Group:  Engaged  Modes of Intervention:  Discussion and Support  Additional Comments:  Pt shared that in her leisure time she enjoys gardening.   Humberto SealsWhitaker, Chaslyn Eisen Monique 06/20/2013, 10:21 PM
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
## 2013-06-20 NOTE — Progress Notes (Signed)
Recreation Therapy Notes  Date: 02.26.2015 Time: 2:45pm Location: [address] Dayroom   Group Topic: Leisure Education  Goal Area(s) Addresses:  Patient will identify positive leisure activities.  Patient will identify positive emotions associated with leisure participation.  Patient will identify one positive benefit of participation in leisure activities.   Behavioral Response: Engaged, Attentive, Appropriate   Intervention: Game  Activity: LRT selected a letter from a bag, using the selected letter patients were asked to identify as many leisure activities as possible that start with the selected letter in 3 minutes. Patients worked in teams of 4. Last round of game was used to ask patients to identify positive emotions associated with leisure participation.   Education:  Leisure Education, Building control surveyorDischarge Planning, Coping Skills   Education Outcome: Acknowledges understanding  Clinical Observations/Feedback: Patient actively engaged in group activity, working well with teammates and identifying words to contribute to team list. Patient contributed to group discussion, relating leisure participation to feeling accomplished. Patient additionally identified benefit of using leisure as a Associate Professorcoping skill.  Marykay Lexenise L Zaahir Pickney, LRT/CTRS  Burak Zerbe L 06/20/2013 4:06 PM

## 2013-06-20 NOTE — Progress Notes (Signed)
Patient ID: Katelyn [name], female   DOB: [birth year], 43 y.o.   MRN: [id number] D: Patient stated she is doing well, her day was weepy but got better after journaling and attending groups. Pt reports decreased anxiety and depressive symptoms. Pt denies suicidal /homicidal ideation intent and plan. Pt denies auditory and visual hallucination. Pt attended evening wrap up group and engaged in discussion. Pt denies any needs or concerns. Cooperative with assessment. No acute distressed noted at this time.   A: Met with pt 1:1. Medications administered as prescribed. Writer encouraged  pt to discuss feelings. Pt encouraged to come to staff with any questions or concerns.   R: Patient is safe on the unit. She is complaint with medications and denies any adverse reaction. Continue current POC.

## 2013-06-21 MED ORDER — TRAZODONE HCL 50 MG PO TABS: 50.0000 mg | ORAL_TABLET | Freq: Every day | ORAL | Status: DC

## 2013-06-21 MED ORDER — VENLAFAXINE HCL ER 75 MG PO CP24: 225.0000 mg | ORAL_CAPSULE | Freq: Every day | ORAL | Status: DC

## 2013-06-21 MED ORDER — ARIPIPRAZOLE 5 MG PO TABS: 5.0000 mg | ORAL_TABLET | Freq: Every day | ORAL | Status: DC

## 2013-06-21 MED ORDER — LAMOTRIGINE 25 MG PO TABS: 25.0000 mg | ORAL_TABLET | Freq: Every morning | ORAL | Status: DC
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
## 2013-06-21 NOTE — Tx Team (Signed)
Interdisciplinary Treatment Plan Update   Date Reviewed:  06/21/2013  Time Reviewed:  9:38 AM  Progress in Treatment:   Attending groups: Yes Participating in groups: Yes Taking medication as prescribed: Yes  Tolerating medication: Yes Family/Significant other contact made:  Yes, collateral contact made with friend. Patient understands diagnosis: Yes  Discussing patient identified problems/goals with staff: Yes Medical problems stabilized or resolved: Yes Denies suicidal/homicidal ideation: Yes Patient has not harmed self or others: Yes  For review of initial/current patient goals, please see plan of care.  Estimated Length of Stay:  Discharge today  Reasons for Continued Hospitalization:   New Problems/Goals identified:    Discharge Plan or Barriers:   Home with outpatient follow [facility], Dr. Melanee LeftUma Throtakura and Jeanella FlatterySarah Young  Additional Comments:    Attendees:  Patient: Katelyn ChurchChrista [name] 06/21/2013 9:38 AM   Signature: Mervyn GayJ. Jonnalagadda, MD 06/21/2013 9:38 AM  Signature:  Silverio DecampJamison Lloyd, NP 06/21/2013 9:38 AM  Signature:  Carney LivingBrittney Tyson, RN 06/21/2013 9:38 AM  Signature  Harold Barbanonecia Byrd, RN 06/21/2013 9:38 AM  Signature:   06/21/2013 9:38 AM  Signature:  Juline PatchQuylle Nicklaus Alviar, LCSW 06/21/2013 9:38 AM  Signature:  Reyes Ivanhelsea [name], LCSW 06/21/2013 9:38 AM  Signature:  06/21/2013 9:38 AM  Signature:   06/21/2013 9:38 AM  Signature:  06/21/2013  9:38 AM  Signature:   Onnie BoerJennifer Clark, RN [facility] 06/21/2013  9:38 AM  Signature:   06/21/2013  9:38 AM    Scribe for Treatment Team:   Juline PatchQuylle Lis Savitt,  06/21/2013 9:38 AM

## 2013-06-21 NOTE — Progress Notes (Signed)
Patient ID: Katelyn [name], female   DOB: December 18, 1970, 43 y.o.   MRN: [id number] D: Patient mood and affect is a lot brighter than previous days. Pt reports possible discharge tomorrow and follow up with IOP. Pt denies suicidal /homicidal ideation intent and plan. Pt denies auditory and visual hallucination. Pt attended evening wrap up group and engaged in discussion. Pt denies any needs or concerns. Cooperative with assessment. No acute distressed noted at this time.   A: Met with pt 1:1. Medications administered as prescribed. Writer encouraged  pt to discuss feelings. Pt encouraged to come to staff with any questions or concerns.   R: Patient is safe on the unit. She is complaint with medications and denies any adverse reaction. Continue current POC.

## 2013-06-21 NOTE — [facility] Suicide Risk Assessment (Signed)
   Demographic Factors:  Adolescent or young adult and Caucasian  Total Time spent with patient: 30 minutes  Psychiatric Specialty Exam: Physical Exam  ROS  Blood pressure 102/68, pulse 94, temperature 98.7 F (37.1 C), temperature source Oral, resp. rate 16, height 5\' 2"  (1.575 m), weight 83.915 kg (185 lb).Body mass index is 33.83 kg/(m^2).  General Appearance: Casual  Eye Contact::  Good  Speech:  Clear and Coherent  Volume:  Normal  Mood:  Euthymic  Affect:  Appropriate and Congruent  Thought Process:  Coherent and Goal Directed  Orientation:  Full (Time, Place, and Person)  Thought Content:  WDL  Suicidal Thoughts:  No  Homicidal Thoughts:  No  Memory:  Immediate;   Good  Judgement:  Intact  Insight:  Good  Psychomotor Activity:  Normal  Concentration:  Good  Recall:  Fair  Fund of Knowledge:Good  Language: Good  Akathisia:  Negative  Handed:  Right  AIMS (if indicated):     Assets:  Communication Skills Desire for Improvement Financial Resources/Insurance Housing Intimacy Leisure Time Physical Health Resilience Social Support Talents/Skills Transportation Vocational/Educational  Sleep:  Number of Hours: 6.5    Musculoskeletal: Strength & Muscle Tone: within normal limits Gait & Station: normal Patient leans: N/A   Mental Status Per Nursing Assessment::   On Admission:     Current Mental Status by Physician: NA  Loss Factors: Financial problems/change in socioeconomic status  Historical Factors: Family history of mental illness or substance abuse  Risk Reduction Factors:   Sense of responsibility to family, Religious beliefs about death, Employed, Living with another person, especially a relative, Positive social support, Positive therapeutic relationship and Positive coping skills or problem solving skills  Continued Clinical Symptoms:  Bipolar Disorder:   Depressive phase Depression:   Recent sense of peace/wellbeing Chronic Pain Medical  Diagnoses and Treatments/Surgeries  Cognitive Features That Contribute To Risk:  Polarized thinking    Suicide Risk:  Minimal: No identifiable suicidal ideation.  Patients presenting with no risk factors but with morbid ruminations; may be classified as minimal risk based on the severity of the depressive symptoms  Discharge Diagnoses:   AXIS I:  Bipolar, Depressed, Generalized Anxiety Disorder and Post Traumatic Stress Disorder AXIS II:  Deferred AXIS III:   Past Medical History  Diagnosis Date  . Arthritis   . Migraine headache   . Depression   . Anxiety   . PTSD (post-traumatic stress disorder)    AXIS IV:  other psychosocial or environmental problems, problems related to social environment and problems with primary support group AXIS V:  61-70 mild symptoms  Plan Of Care/Follow-up recommendations:  Activity:  As tolerated Diet:  Regular  Is patient on multiple antipsychotic therapies at discharge:  No   Has Patient had three or more failed trials of antipsychotic monotherapy by history:  No  Recommended Plan for Multiple Antipsychotic Therapies: NA    Jarrod Mcenery,JANARDHAHA R. 06/21/2013, 12:52 PM
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
## 2013-06-21 NOTE — Discharge Instructions (Signed)
Bipolar Disorder °Bipolar disorder is a mental illness. The term bipolar disorder actually is used to describe a group of disorders that all share varying degrees of emotional highs and lows that can interfere with daily functioning, such as work, school, or relationships. Bipolar disorder also can lead to drug abuse, hospitalization, and suicide. °The emotional highs of bipolar disorder are periods of elation or irritability and high energy. These highs can range from a mild form (hypomania) to a severe form (mania). People experiencing episodes of hypomania may appear energetic, excitable, and highly productive. People experiencing mania may behave impulsively or erratically. They often make poor decisions. They may have difficulty sleeping. The most severe episodes of mania can involve having very distorted beliefs or perceptions about the world and seeing or hearing things that are not real (psychotic delusions and hallucinations).  °The emotional lows of bipolar disorder (depression) also can range from mild to severe. Severe episodes of bipolar depression can involve psychotic delusions and hallucinations. °Sometimes people with bipolar disorder experience a state of mixed mood. Symptoms of hypomania or mania and depression are both present during this mixed-mood episode. °SIGNS AND SYMPTOMS °There are signs and symptoms of the episodes of hypomania and mania as well as the episodes of depression. The signs and symptoms of hypomania and mania are similar but vary in severity. They include: °· Inflated self-esteem or feeling of increased self-confidence. °· Decreased need for sleep. °· Unusual talkativeness (rapid or pressured speech) or the feeling of a need to keep talking. °· Sensation of racing thoughts or constant talking, with quick shifts between topics that may or may not be related (flight of ideas). °· Decreased ability to focus or concentrate. °· Increased purposeful activity, such as work, studies,  or social activity, or nonproductive activity, such as pacing, squirming and fidgeting, or finger and toe tapping. °· Impulsive behavior and use of poor judgment, resulting in high-risk activities, such as having unprotected sex or spending excessive amounts of money. °Signs and symptoms of depression include the following:  °· Feelings of sadness, hopelessness, or helplessness. °· Frequent or uncontrollable episodes of crying. °· Lack of feeling anything or caring about anything. °· Difficulty sleeping or sleeping too much.  °· Inability to enjoy the things you used to enjoy.   °· Desire to be alone all the time.   °· Feelings of guilt or worthlessness.  °· Lack of energy or motivation.   °· Difficulty concentrating, remembering, or making decisions.  °· Change in appetite or weight beyond normal fluctuations. °· Thoughts of death or the desire to harm yourself. °DIAGNOSIS  °Bipolar disorder is diagnosed through an assessment by your caregiver. Your caregiver will ask questions about your emotional episodes. There are two main types of bipolar disorder. People with type I bipolar disorder have manic episodes with or without depressive episodes. People with type II bipolar disorder have hypomanic episodes and major depressive episodes, which are more serious than mild depression. The type of bipolar disorder you have can make an important difference in how your illness is monitored and treated. °Your caregiver may ask questions about your medical history and use of alcohol or drugs, including prescription medication. Certain medical conditions and substances also can cause emotional highs and lows that resemble bipolar disorder (secondary bipolar disorder).  °TREATMENT  °Bipolar disorder is a long-term illness. It is best controlled with continuous treatment rather than treatment only when symptoms occur. The following treatments can be prescribed for bipolar disorders: °· Medication Medication can be prescribed by  a doctor   that is an expert in treating mental disorders (psychiatrists). Medications called mood stabilizers are usually prescribed to help control the illness. Other medications are sometimes added if symptoms of mania, depression, or psychotic delusions and hallucinations occur despite the use of a mood stabilizer. °· Talk therapy Some forms of talk therapy are helpful in providing support, education, and guidance. °A combination of medication and talk therapy is best for managing the disorder over time. A procedure in which electricity is applied to your brain through your scalp (electroconvulsive therapy) is used in cases of severe mania when medication and talk therapy do not work or work too slowly. °Document Released: 07/18/2000 Document Revised: 08/06/2012 Document Reviewed: 05/07/2012 °ExitCare® Patient Information ©2014 [employer]. ° °

## 2013-06-21 NOTE — Progress Notes (Signed)
Recreation Therapy Notes  Date: 02.27.2015 Time: 2:45pm Location: [address] Dayroom    Group Topic: Communication, Team Building, Problem Solving  Goal Area(s) Addresses:  Patient will effectively work with peer towards shared goal.  Patient will identify skill used to make activity successful.  Patient will identify how skills used during activity can be used to build healthy support system.   Behavioral Response: Did not attend. Patient preparing for d/c during recreation therapy group session.   Marykay Lexenise L Dekota Shenk, LRT/CTRS  Merlinda Wrubel L 06/21/2013 4:16 PM

## 2013-06-21 NOTE — [facility] Group Notes (Signed)
[facility] LCSW Group Therapy  Feelings Around Relapse 1:15 -2:30        06/21/2013  3:32 PM   Type of Therapy:  Group Therapy  Participation Level:  Appropriate  Participation Quality:  Appropriate  Affect:  Appropriate  Cognitive:  Attentive Appropriate  Insight:  Developing/Improving  Engagement in Therapy: Developing/Improving  Modes of Intervention:  Discussion Exploration Problem-Solving Supportive  Summary of Progress/Problems:  The topic for today was feelings around relapse.    Patient processed feelings toward relapse and was able to relate to peers.   She shared relapse for her means isolating from friends and not expressing her needs.  Patient shared she has learned she can not do it all and needs to ask for help.  Patient able to identified coping skills that can be used to prevent a relapse.   Wynn BankerHodnett, Tiaunna Buford Hairston 06/21/2013 3:32 PM

## 2013-06-21 NOTE — Progress Notes (Signed)
Discharge Note: Discharge instructions and prescriptions given to patient. Patient verbalized understanding of discharge instructions and prescriptions. Returned belongings to patient. Denies SI/HI/AVH. Patient d/c without incident to the lobby and transported home by a friend. 

## 2013-06-24 NOTE — Progress Notes (Signed)
Patient Discharge Instructions:  After Visit Summary (AVS):   Faxed to:  06/24/13 Discharge Summary Note:   Faxed to:  06/24/13 Psychiatric Admission Assessment Note:   Faxed to:  06/24/13 Suicide Risk Assessment - Discharge Assessment:   Faxed to:  06/24/13 Faxed/Sent to the Next Level Care provider:  06/24/13 Next Level Care Provider Has Access to the EMR, 06/24/13 Faxed to [facility] @ [phone] Faxed to [facility] @ [phone] Records provided to [facility] via CHl/Epic access.  Jerelene ReddenSheena E [address], 06/24/2013, 3:24 PM

## 2013-07-01 ENCOUNTER — Encounter (HOSPITAL_COMMUNITY): Payer: Self-pay

## 2013-07-01 ENCOUNTER — Encounter (HOSPITAL_COMMUNITY): Payer: BC Managed Care – PPO | Attending: Psychiatry | Admitting: Psychiatry

## 2013-07-01 DIAGNOSIS — Z609 Problem related to social environment, unspecified: Secondary | ICD-10-CM | POA: Insufficient documentation

## 2013-07-01 DIAGNOSIS — F314 Bipolar disorder, current episode depressed, severe, without psychotic features: Secondary | ICD-10-CM

## 2013-07-01 DIAGNOSIS — Z79899 Other long term (current) drug therapy: Secondary | ICD-10-CM | POA: Insufficient documentation

## 2013-07-01 DIAGNOSIS — M129 Arthropathy, unspecified: Secondary | ICD-10-CM | POA: Insufficient documentation

## 2013-07-01 DIAGNOSIS — G43909 Migraine, unspecified, not intractable, without status migrainosus: Secondary | ICD-10-CM | POA: Insufficient documentation

## 2013-07-01 DIAGNOSIS — Z598 Other problems related to housing and economic circumstances: Secondary | ICD-10-CM | POA: Insufficient documentation

## 2013-07-01 DIAGNOSIS — IMO0002 Reserved for concepts with insufficient information to code with codable children: Secondary | ICD-10-CM | POA: Insufficient documentation

## 2013-07-01 DIAGNOSIS — Z6379 Other stressful life events affecting family and household: Secondary | ICD-10-CM | POA: Insufficient documentation

## 2013-07-01 DIAGNOSIS — Z5987 Material hardship due to limited financial resources, not elsewhere classified: Secondary | ICD-10-CM | POA: Insufficient documentation

## 2013-07-01 DIAGNOSIS — Z818 Family history of other mental and behavioral disorders: Secondary | ICD-10-CM | POA: Insufficient documentation

## 2013-07-01 DIAGNOSIS — F3160 Bipolar disorder, current episode mixed, unspecified: Secondary | ICD-10-CM | POA: Insufficient documentation

## 2013-07-01 DIAGNOSIS — F339 Major depressive disorder, recurrent, unspecified: Secondary | ICD-10-CM

## 2013-07-01 DIAGNOSIS — R45851 Suicidal ideations: Secondary | ICD-10-CM | POA: Insufficient documentation

## 2013-07-01 DIAGNOSIS — F4310 Post-traumatic stress disorder, unspecified: Secondary | ICD-10-CM

## 2013-07-01 NOTE — Progress Notes (Signed)
    Daily Group Progress Note  Program: IOP  Group Time: 9:00-10:30  Participation Level: Active  Behavioral Response: Appropriate  Type of Therapy:  Group Therapy  Summary of Progress: Pt. Met with case manager and psychiatrist.     Group Time: 10:30-12:00  Participation Level:  Active  Behavioral Response: Appropriate  Type of Therapy: Psycho-education Group  Summary of Progress: Pt. Met with case manager and psychiatrist.  Brown, Jennifer B, COUNS 
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
## 2013-07-02 ENCOUNTER — Other Ambulatory Visit (HOSPITAL_COMMUNITY): Payer: BC Managed Care – PPO | Attending: Psychiatry | Admitting: Psychiatry

## 2013-07-02 ENCOUNTER — Encounter (HOSPITAL_COMMUNITY): Payer: Self-pay | Admitting: Psychiatry

## 2013-07-02 DIAGNOSIS — IMO0002 Reserved for concepts with insufficient information to code with codable children: Secondary | ICD-10-CM | POA: Insufficient documentation

## 2013-07-02 DIAGNOSIS — M129 Arthropathy, unspecified: Secondary | ICD-10-CM | POA: Insufficient documentation

## 2013-07-02 DIAGNOSIS — F4310 Post-traumatic stress disorder, unspecified: Secondary | ICD-10-CM | POA: Insufficient documentation

## 2013-07-02 DIAGNOSIS — F3160 Bipolar disorder, current episode mixed, unspecified: Secondary | ICD-10-CM | POA: Insufficient documentation

## 2013-07-02 DIAGNOSIS — F411 Generalized anxiety disorder: Secondary | ICD-10-CM | POA: Insufficient documentation

## 2013-07-02 DIAGNOSIS — F319 Bipolar disorder, unspecified: Secondary | ICD-10-CM

## 2013-07-02 NOTE — Progress Notes (Deleted)
    Daily Group Progress Note  Program: IOP  Group Time: 9:00-10:30   Participation Level: Active   Behavioral Response: Appropriate and Sharing   Type of Therapy: Group Therapy   Summary of Progress: Pt. Participated in heartmath meditation. Pt. Discussed process of forgiveness and acceptance of her mother, desire to continue seeking healthy work environment, acceptance that her approach to her work environment will need to change in order for her to peace and contentment in her current work.   Group Time: 10:30-12:00   Participation Level: Active   Behavioral Response: Appropriate   Type of Therapy: Psycho-education Group   Summary of Progress: Pt. Participated in group about expectations, loss related to disappointed sense of entitlement, and grief process.    Brown, Jennifer B, COUNS 

## 2013-07-02 NOTE — Progress Notes (Signed)
    Daily Group Progress Note  Program: IOP  Group Time: 9:00-10:30  Participation Level: Active  Behavioral Response: Appropriate and Sharing  Type of Therapy:  Group Therapy  Summary of Progress: Pt. Participated in heartmath meditation. Pt. Participated in discussion about the nature of forgiveness and the challenge/burden of not being ready to forgive.     Group Time: 10:30-12:00  Participation Level:  Active  Behavioral Response: Appropriate and Sharing  Type of Therapy: Psycho-education Group  Summary of Progress: Pt. Participated in group about expectations, loss association with a denied expectation, and the grief process.  Shaune PollackBrown, Ralph Brouwer B, COUNS

## 2013-07-03 ENCOUNTER — Other Ambulatory Visit (HOSPITAL_COMMUNITY): Payer: BC Managed Care – PPO | Admitting: Psychiatry

## 2013-07-03 ENCOUNTER — Encounter (HOSPITAL_COMMUNITY): Payer: Self-pay | Admitting: Psychiatry

## 2013-07-03 DIAGNOSIS — F4310 Post-traumatic stress disorder, unspecified: Secondary | ICD-10-CM

## 2013-07-03 DIAGNOSIS — F411 Generalized anxiety disorder: Secondary | ICD-10-CM
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
## 2013-07-04 ENCOUNTER — Other Ambulatory Visit (HOSPITAL_COMMUNITY): Payer: BC Managed Care – PPO | Admitting: Psychiatry

## 2013-07-04 ENCOUNTER — Encounter (HOSPITAL_COMMUNITY): Payer: Self-pay | Admitting: Psychiatry

## 2013-07-04 DIAGNOSIS — F319 Bipolar disorder, unspecified: Secondary | ICD-10-CM

## 2013-07-04 NOTE — Progress Notes (Signed)
    Daily Group Progress Note  Program: IOP  Group Time: 9:00-10:30  Participation Level: Active  Behavioral Response: Appropriate and Sharing  Type of Therapy:  Group Therapy  Summary of Progress: Pt. Participated in heartmath and morning meditation. Pt. Reported muscle pain that she believed to be a side effect of the medication. Pt. Participated in group discussion focused on developing self-acceptance, loneliness, and changing negative self-talk.     Group Time: 10:30-12:00  Participation Level:  Active  Behavioral Response: Appropriate and Sharing  Type of Therapy: Psycho-education Group  Summary of Progress: Pt. Participated in group focused on developing healthy self-talk.  Shaune PollackBrown, Lurleen Soltero B, COUNS

## 2013-07-05 ENCOUNTER — Encounter (HOSPITAL_COMMUNITY): Payer: Self-pay | Admitting: Psychiatry

## 2013-07-05 ENCOUNTER — Other Ambulatory Visit (HOSPITAL_COMMUNITY): Payer: BC Managed Care – PPO | Admitting: Psychiatry

## 2013-07-05 DIAGNOSIS — F319 Bipolar disorder, unspecified: Secondary | ICD-10-CM

## 2013-07-05 NOTE — Progress Notes (Signed)
    Daily Group Progress Note  Program: IOP  Group Time: 9:00-10:30  Participation Level: Active  Behavioral Response: Appropriate  Type of Therapy:  Group Therapy  Summary of Progress: Pt. Participated in heartmath and morning meditation. Pt. Reported feeling some anxiety related to participation in [employer] service tomorrow. Pt. Reported plan for stress management over the weekend and managing need to overwork/perfect her performance in certain situations.     Group Time: 10:30-12:00  Participation Level:  Active  Behavioral Response: Appropriate  Type of Therapy: Psycho-education Group  Summary of Progress: Pt. Participated in discussion on developing distress tolerance/self-soothing behaviors.  Shaune PollackBrown, Jennifer B, COUNS

## 2013-07-05 NOTE — Progress Notes (Signed)
Patient ID: Katelyn JourneyChrista S Porter, female   DOB: 12/15/1970, 43 y.o.   MRN: [id number] D:  Pt is c/o ongoing leg pain and arm discomfort.  Spoke to Dr. Lucianne MussKumar in Dr. Debbora Prestoadepalli's absence.  Dr. Lucianne MussKumar instructed this writer to call in Cogentin 0.5 mg BID, # 30, no refills.  A:  Called in medication.

## 2013-07-08 ENCOUNTER — Other Ambulatory Visit (HOSPITAL_COMMUNITY): Payer: BC Managed Care – PPO | Admitting: Psychiatry

## 2013-07-08 ENCOUNTER — Encounter (HOSPITAL_COMMUNITY): Payer: Self-pay | Admitting: Psychiatry

## 2013-07-08 DIAGNOSIS — F319 Bipolar disorder, unspecified: Secondary | ICD-10-CM

## 2013-07-08 NOTE — Progress Notes (Signed)
    Daily Group Progress Note  Program: IOP  Group Time: 9:00-10:30  Participation Level: Active  Behavioral Response: Appropriate and Sharing  Type of Therapy:  Group Therapy  Summary of Progress: Pt. Participated in heartmath and morning meditation. Pt. Shared success of participating in friend's memorial service, feeling peace and contentment with her participation compared to pattern of self-doubt about her performance.     Group Time: 10:30-12:00  Participation Level:  Active  Behavioral Response: Appropriate and Sharing  Type of Therapy: Psycho-education Group  Summary of Progress: Pt. Participated in discussion about how to develop "wise mind" in relationships.  Shaune PollackBrown, Jennifer B, COUNS

## 2013-07-09 ENCOUNTER — Other Ambulatory Visit (HOSPITAL_COMMUNITY): Payer: BC Managed Care – PPO

## 2013-07-10 ENCOUNTER — Other Ambulatory Visit (HOSPITAL_COMMUNITY): Payer: BC Managed Care – PPO | Admitting: Psychiatry

## 2013-07-10 ENCOUNTER — Encounter (HOSPITAL_COMMUNITY): Payer: Self-pay | Admitting: Psychiatry

## 2013-07-10 DIAGNOSIS — F319 Bipolar disorder, unspecified: Secondary | ICD-10-CM
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
## 2013-07-11 ENCOUNTER — Other Ambulatory Visit (HOSPITAL_COMMUNITY): Payer: BC Managed Care – PPO

## 2013-07-12 ENCOUNTER — Other Ambulatory Visit (HOSPITAL_COMMUNITY): Payer: BC Managed Care – PPO | Admitting: Psychiatry

## 2013-07-12 ENCOUNTER — Encounter (HOSPITAL_COMMUNITY): Payer: Self-pay | Admitting: Psychiatry

## 2013-07-12 DIAGNOSIS — F319 Bipolar disorder, unspecified: Secondary | ICD-10-CM

## 2013-07-12 NOTE — Progress Notes (Signed)
    Daily Group Progress Note  Program: IOP  Group Time: 9:00-10:30  Participation Level: Active  Behavioral Response: Appropriate  Type of Therapy:  Group Therapy  Summary of Progress: Pt. Participated in discussion about facing fear related to entering new relationships. Pt. Participated in discussion about creating healthy relationship boundaries and identifying unhealthy relationship behaviors.     Group Time: 10:30-12:00  Participation Level:  Active  Behavioral Response: Appropriate  Type of Therapy: Psycho-education Group  Summary of Progress: Pt. Participated in intention creating exercise and discussion about how to cultivate joy and pleasure in our lives. Pt. Participated in discharge ceremony.  Shaune PollackBrown, Jahquez Steffler B, COUNS
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
## 2013-07-12 NOTE — Patient Instructions (Signed)
Patient completed MH-IOP today.  Will follow up with Verlan FriendsSara Young, LPC on 07-16-13 @11  a.m. And Dr. Robet Leuhotakura on 07-23-13 @ 4 pm.  Encouraged support groups.

## 2013-07-15 ENCOUNTER — Other Ambulatory Visit (HOSPITAL_COMMUNITY): Payer: BC Managed Care – PPO

## 2013-07-15 NOTE — Progress Notes (Unsigned)
  [facility] Discharge Summary  Radene JourneyChrista S [place]aco [id number]  Admission date: 07/01/2013 Discharge date: 07/12/2013 This patient no showed for this appointment.

## 2013-07-16 ENCOUNTER — Other Ambulatory Visit (HOSPITAL_COMMUNITY): Payer: BC Managed Care – PPO

## 2013-07-17 ENCOUNTER — Other Ambulatory Visit (HOSPITAL_COMMUNITY): Payer: BC Managed Care – PPO

## 2013-07-18 ENCOUNTER — Other Ambulatory Visit (HOSPITAL_COMMUNITY): Payer: BC Managed Care – PPO
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
## 2013-07-19 ENCOUNTER — Other Ambulatory Visit (HOSPITAL_COMMUNITY): Payer: BC Managed Care – PPO

## 2013-07-22 ENCOUNTER — Other Ambulatory Visit (HOSPITAL_COMMUNITY): Payer: BC Managed Care – PPO

## 2013-11-15 ENCOUNTER — Emergency Department (HOSPITAL_COMMUNITY)
Admission: EM | Admit: 2013-11-15 | Discharge: 2013-11-16 | Disposition: A | Discharge status: Home / Self Care | Payer: BC Managed Care – PPO | Attending: Emergency Medicine | Admitting: Emergency Medicine

## 2013-11-15 DIAGNOSIS — Z3202 Encounter for pregnancy test, result negative: Secondary | ICD-10-CM | POA: Insufficient documentation

## 2013-11-15 DIAGNOSIS — G43909 Migraine, unspecified, not intractable, without status migrainosus: Secondary | ICD-10-CM | POA: Insufficient documentation

## 2013-11-15 DIAGNOSIS — IMO0002 Reserved for concepts with insufficient information to code with codable children: Secondary | ICD-10-CM

## 2013-11-15 DIAGNOSIS — T7421XA Adult sexual abuse, confirmed, initial encounter: Secondary | ICD-10-CM | POA: Insufficient documentation

## 2013-11-15 DIAGNOSIS — F3111 Bipolar disorder, current episode manic without psychotic features, mild: Secondary | ICD-10-CM | POA: Insufficient documentation

## 2013-11-15 DIAGNOSIS — Z8739 Personal history of other diseases of the musculoskeletal system and connective tissue: Secondary | ICD-10-CM | POA: Insufficient documentation

## 2013-11-15 DIAGNOSIS — F411 Generalized anxiety disorder: Secondary | ICD-10-CM | POA: Insufficient documentation

## 2013-11-15 DIAGNOSIS — F4310 Post-traumatic stress disorder, unspecified: Secondary | ICD-10-CM | POA: Insufficient documentation

## 2013-11-15 DIAGNOSIS — Z79899 Other long term (current) drug therapy: Secondary | ICD-10-CM | POA: Insufficient documentation

## 2013-11-15 HISTORY — DX: Bipolar disorder, current episode manic without psychotic features, mild: F31.11
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
## 2013-11-16 ENCOUNTER — Encounter (HOSPITAL_COMMUNITY): Payer: Self-pay | Admitting: Emergency Medicine

## 2013-11-16 MED ORDER — AZITHROMYCIN 250 MG PO TABS
1000.0000 mg | ORAL_TABLET | Freq: Once | ORAL | Status: AC
  Administered 2013-11-16: 1000 mg via ORAL
  Filled 2013-11-16: qty 4

## 2013-11-16 MED ORDER — METRONIDAZOLE 500 MG PO TABS
1000.0000 mg | ORAL_TABLET | Freq: Once | ORAL | Status: AC
  Administered 2013-11-16: 1000 mg via ORAL
  Filled 2013-11-16: qty 2

## 2013-11-16 MED ORDER — LIDOCAINE HCL 1 % IJ SOLN
INTRAMUSCULAR | Status: AC
  Administered 2013-11-16: 1 mL via INTRAMUSCULAR
  Filled 2013-11-16: qty 20

## 2013-11-16 MED ORDER — CEFTRIAXONE SODIUM 250 MG IJ SOLR
250.0000 mg | Freq: Once | INTRAMUSCULAR | Status: AC
  Administered 2013-11-16: 250 mg via INTRAMUSCULAR
  Filled 2013-11-16: qty 250

## 2013-11-16 MED ORDER — PROMETHAZINE HCL 25 MG PO TABS: 12.5000 mg | ORAL_TABLET | Freq: Once | ORAL | Status: DC

## 2013-11-16 MED ORDER — ALPRAZOLAM 0.5 MG PO TABS
0.5000 mg | ORAL_TABLET | Freq: Once | ORAL | Status: AC
  Administered 2013-11-16: 0.5 mg via ORAL
  Filled 2013-11-16: qty 1
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
## 2013-11-16 NOTE — ED Notes (Signed)
Note:  This pt. Was d/c'd. For S.A.N.E. Exam before my arrival.  We had held the chart for possible additional necessary interventions, which never arose.  I am simply dispositioning her now.
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
## 2013-11-18 LAB — POC URINE PREG, ED: Preg Test, Ur: NEGATIVE

## 2014-02-11 ENCOUNTER — Encounter (HOSPITAL_COMMUNITY): Payer: Self-pay | Admitting: *Deleted

## 2014-02-11 ENCOUNTER — Inpatient Hospital Stay (HOSPITAL_COMMUNITY)
Admission: RE | Admit: 2014-02-11 | Discharge: 2014-02-14 | DRG: 885 | Disposition: A | Discharge status: Home / Self Care | Payer: BC Managed Care – PPO | Source: Intra-hospital | Attending: Psychiatry | Admitting: Psychiatry

## 2014-02-11 DIAGNOSIS — Z599 Problem related to housing and economic circumstances, unspecified: Secondary | ICD-10-CM

## 2014-02-11 DIAGNOSIS — G4700 Insomnia, unspecified: Secondary | ICD-10-CM | POA: Diagnosis present

## 2014-02-11 DIAGNOSIS — F319 Bipolar disorder, unspecified: Secondary | ICD-10-CM | POA: Diagnosis present

## 2014-02-11 DIAGNOSIS — Z23 Encounter for immunization: Secondary | ICD-10-CM | POA: Diagnosis not present

## 2014-02-11 DIAGNOSIS — F411 Generalized anxiety disorder: Secondary | ICD-10-CM | POA: Diagnosis present

## 2014-02-11 DIAGNOSIS — R45851 Suicidal ideations: Secondary | ICD-10-CM | POA: Diagnosis present

## 2014-02-11 DIAGNOSIS — M1990 Unspecified osteoarthritis, unspecified site: Secondary | ICD-10-CM | POA: Diagnosis present

## 2014-02-11 DIAGNOSIS — F3160 Bipolar disorder, current episode mixed, unspecified: Secondary | ICD-10-CM

## 2014-02-11 DIAGNOSIS — F4310 Post-traumatic stress disorder, unspecified: Secondary | ICD-10-CM | POA: Diagnosis present

## 2014-02-11 DIAGNOSIS — F3112 Bipolar disorder, current episode manic without psychotic features, moderate: Secondary | ICD-10-CM | POA: Diagnosis present

## 2014-02-11 LAB — LITHIUM LEVEL: Lithium Lvl: 0.98 mEq/L (ref 0.80–1.40)

## 2014-02-11 LAB — RAPID URINE DRUG SCREEN, HOSP PERFORMED
Amphetamines: NOT DETECTED
Barbiturates: NOT DETECTED
Benzodiazepines: POSITIVE — AB
COCAINE: NOT DETECTED
Opiates: NOT DETECTED
Tetrahydrocannabinol: NOT DETECTED

## 2014-02-11 LAB — COMPREHENSIVE METABOLIC PANEL
ALT: 22 U/L (ref 0–35)
AST: 29 U/L (ref 0–37)
Albumin: 3.4 g/dL — ABNORMAL LOW (ref 3.5–5.2)
Alkaline Phosphatase: 63 U/L (ref 39–117)
Anion gap: 15 (ref 5–15)
BILIRUBIN TOTAL: 0.5 mg/dL (ref 0.3–1.2)
BUN: 10 mg/dL (ref 6–23)
CHLORIDE: 98 meq/L (ref 96–112)
CO2: 24 mEq/L (ref 19–32)
CREATININE: 0.88 mg/dL (ref 0.50–1.10)
Calcium: 9.1 mg/dL (ref 8.4–10.5)
GFR calc Af Amer: >90 mL/min (ref >90)
GFR calc non Af Amer: 81 mL/min — ABNORMAL LOW (ref >90)
Glucose, Bld: 117 mg/dL — ABNORMAL HIGH (ref 70–99)
Potassium: 3.8 mEq/L (ref 3.7–5.3)
SODIUM: 137 meq/L (ref 137–147)
Total Protein: 6.5 g/dL (ref 6.0–8.3)

## 2014-02-11 LAB — CBC
HEMATOCRIT: 40.3 % (ref 36.0–46.0)
Hemoglobin: 14 g/dL (ref 12.0–15.0)
MCH: 32.1 pg (ref 26.0–34.0)
MCHC: 34.7 g/dL (ref 30.0–36.0)
MCV: 92.4 fL (ref 78.0–100.0)
Platelets: 279 10*3/uL (ref 150–400)
RBC: 4.36 MIL/uL (ref 3.87–5.11)
RDW: 12.5 % (ref 11.5–15.5)
WBC: 11 10*3/uL — ABNORMAL HIGH (ref 4.0–10.5)

## 2014-02-11 LAB — URINALYSIS, ROUTINE W REFLEX MICROSCOPIC
Bilirubin Urine: NEGATIVE
GLUCOSE, UA: NEGATIVE mg/dL
Ketones, ur: NEGATIVE mg/dL
Nitrite: NEGATIVE
PH: 5.5 (ref 5.0–8.0)
Protein, ur: NEGATIVE mg/dL
Specific Gravity, Urine: 1.011 (ref 1.005–1.030)
UROBILINOGEN UA: 0.2 mg/dL (ref 0.0–1.0)

## 2014-02-11 LAB — URINE MICROSCOPIC-ADD ON

## 2014-02-11 LAB — LIPID PANEL
CHOL/HDL RATIO: 5.7 ratio
CHOLESTEROL: 240 mg/dL — AB (ref 0–200)
HDL: 42 mg/dL (ref >39)
LDL CALC: 160 mg/dL — AB (ref 0–99)
Triglycerides: 190 mg/dL — ABNORMAL HIGH (ref <150)
VLDL: 38 mg/dL (ref 0–40)

## 2014-02-11 LAB — PREGNANCY, URINE: Preg Test, Ur: NEGATIVE

## 2014-02-11 LAB — TSH: TSH: 3.83 u[IU]/mL (ref 0.350–4.500)

## 2014-02-11 MED ORDER — NORGESTIMATE-ETH ESTRADIOL 0.25-35 MG-MCG PO TABS
1.0000 | ORAL_TABLET | Freq: Every day | ORAL | Status: DC
  Administered 2014-02-12 – 2014-02-13 (×2): 1 via ORAL

## 2014-02-11 MED ORDER — NORGESTIMATE-ETH ESTRADIOL 0.25-35 MG-MCG PO TABS
1.0000 | ORAL_TABLET | Freq: Every day | ORAL | Status: DC
  Administered 2014-02-11: 1 via ORAL

## 2014-02-11 MED ORDER — INFLUENZA VAC SPLIT QUAD 0.5 ML IM SUSY
0.5000 mL | PREFILLED_SYRINGE | INTRAMUSCULAR | Status: AC
  Administered 2014-02-13: 0.5 mL via INTRAMUSCULAR
  Filled 2014-02-11: qty 0.5

## 2014-02-11 MED ORDER — TRAZODONE HCL 100 MG PO TABS
100.0000 mg | ORAL_TABLET | Freq: Every day | ORAL | Status: DC
  Administered 2014-02-11 (×2): 100 mg via ORAL
  Filled 2014-02-11 (×5): qty 1

## 2014-02-11 MED ORDER — TOPIRAMATE 25 MG PO TABS
50.0000 mg | ORAL_TABLET | Freq: Every day | ORAL | Status: DC
  Administered 2014-02-11 – 2014-02-13 (×4): 50 mg via ORAL
  Filled 2014-02-11 (×7): qty 2

## 2014-02-11 MED ORDER — LORATADINE 10 MG PO TABS
10.0000 mg | ORAL_TABLET | Freq: Every day | ORAL | Status: DC
  Administered 2014-02-11 – 2014-02-14 (×4): 10 mg via ORAL
  Filled 2014-02-11 (×6): qty 1

## 2014-02-11 MED ORDER — ETANERCEPT 50 MG/ML ~~LOC~~ SOLN: 50.0000 mg | SUBCUTANEOUS | Status: DC

## 2014-02-11 MED ORDER — ALUM & MAG HYDROXIDE-SIMETH 200-200-20 MG/5ML PO SUSP: 30.0000 mL | ORAL | Status: DC | PRN

## 2014-02-11 MED ORDER — LITHIUM CARBONATE 300 MG PO CAPS
300.0000 mg | ORAL_CAPSULE | Freq: Every morning | ORAL | Status: DC
  Administered 2014-02-11 – 2014-02-14 (×4): 300 mg via ORAL
  Filled 2014-02-11 (×6): qty 1

## 2014-02-11 MED ORDER — ACETAMINOPHEN 325 MG PO TABS: 650.0000 mg | ORAL_TABLET | Freq: Four times a day (QID) | ORAL | Status: DC | PRN

## 2014-02-11 MED ORDER — MAGNESIUM HYDROXIDE 400 MG/5ML PO SUSP: 30.0000 mL | Freq: Every day | ORAL | Status: DC | PRN

## 2014-02-11 MED ORDER — DULOXETINE HCL 60 MG PO CPEP
60.0000 mg | ORAL_CAPSULE | Freq: Every day | ORAL | Status: DC
  Administered 2014-02-11 – 2014-02-12 (×2): 60 mg via ORAL
  Filled 2014-02-11 (×4): qty 1

## 2014-02-11 MED ORDER — ETANERCEPT 50 MG/ML ~~LOC~~ SOSY: 50.0000 mg | PREFILLED_SYRINGE | SUBCUTANEOUS | Status: DC

## 2014-02-11 MED ORDER — SUMATRIPTAN SUCCINATE 25 MG PO TABS: 100.0000 mg | ORAL_TABLET | ORAL | Status: DC | PRN

## 2014-02-11 MED ORDER — LAMOTRIGINE 100 MG PO TABS
200.0000 mg | ORAL_TABLET | Freq: Every day | ORAL | Status: DC
  Administered 2014-02-11: 200 mg via ORAL
  Filled 2014-02-11 (×2): qty 1
  Filled 2014-02-11 (×2): qty 2

## 2014-02-11 MED ORDER — ALPRAZOLAM ER 0.5 MG PO TB24
1.0000 mg | ORAL_TABLET | Freq: Every day | ORAL | Status: DC
  Administered 2014-02-11 – 2014-02-14 (×4): 1 mg via ORAL
  Filled 2014-02-11 (×4): qty 2

## 2014-02-11 MED ORDER — LITHIUM CARBONATE 300 MG PO CAPS
600.0000 mg | ORAL_CAPSULE | Freq: Every day | ORAL | Status: DC
  Administered 2014-02-11 – 2014-02-13 (×4): 600 mg via ORAL
  Filled 2014-02-11 (×7): qty 2
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
## 2014-02-11 NOTE — Progress Notes (Signed)
Recreation Therapy Notes  Animal-Assisted Activity/Therapy (AAA/T) Program Checklist/Progress Notes Patient Eligibility Criteria Checklist & Daily Group note for Rec Tx Intervention  Date: 10.20.2015 Time: 2:45pm Location: [address] Dayroom   AAA/T Program Assumption of Risk Form signed by Patient/ or Parent Legal Guardian yes  Patient is free of allergies or sever asthma yes  Patient reports no fear of animals yes  Patient reports no history of cruelty to animals yes   Patient understands his/her participation is voluntary yes  Patient washes hands before animal contact yes  Patient washes hands after animal contact yes  Behavioral Response: Appropriate   Education: Hand Washing, Appropriate Animal Interaction   Education Outcome: Acknowledges education.   Clinical Observations/Feedback: Patient interacted appropriately with therapy dog, petting him appropriately.   Marykay Lexenise L Cadell Gabrielson, LRT/CTRS  Jasier Calabretta L 02/11/2014 4:11 PM

## 2014-02-11 NOTE — [facility] Group Notes (Signed)
[facility] LCSW Group Therapy  02/11/2014   1:15 PM   Type of Therapy:  Group Therapy  Participation Level:  Active  Participation Quality:  Attentive, Sharing and Supportive  Affect:  Depressed and Flat  Cognitive:  Alert and Oriented  Insight:  Developing/Improving and Engaged  Engagement in Therapy:  Developing/Improving and Engaged  Modes of Intervention:  Clarification, Confrontation, Discussion, Education, Exploration, Limit-setting, Orientation, Problem-solving, Rapport Building, Dance movement psychotherapisteality Testing, Socialization and Support  Summary of Progress/Problems: The topic for group therapy was feelings about diagnosis.  Pt actively participated in group discussion on their past and current diagnosis and how they feel towards this.  Pt also identified how society and family members judge them, based on their diagnosis as well as stereotypes and stigmas.  Patient shared her frustrations and feelings of anger regarding her relapse into her mental illness. CSW's provided emotional support and encouragement.  Samuella BruinKristin Azana Kiesler, MSW, [employer]CSWA Clinical Social Worker [facility] [phone]
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
## 2014-02-11 NOTE — [facility] Suicide Risk Assessment (Signed)
   Nursing information obtained from:  Patient Demographic factors:  Divorced or widowed;Caucasian;Unemployed Current Mental Status:  Self-harm thoughts Loss Factors:  Decrease in vocational status;Financial problems / change in socioeconomic status Historical Factors:  Prior suicide attempts;Family history of mental illness or substance abuse Risk Reduction Factors:  Responsible for children under 41 years of age;Living with another person, especially a relative;Positive social support Total Time spent with patient: 45 minutes  CLINICAL FACTORS:  Bipolar Disorder, currently presenting with mixed symptoms  Psychiatric Specialty Exam: Physical Exam  ROS  Blood pressure 105/49, pulse 87, temperature 98.4 F (36.9 C), temperature source Oral, resp. rate 18, height 5' 2.75" (1.594 m), weight 90.266 kg (199 lb), last menstrual period 02/07/2014.Body mass index is 35.53 kg/(m^2).  SEE ADMIT MSE   COGNITIVE FEATURES THAT CONTRIBUTE TO RISK:  Closed-mindedness    SUICIDE RISK:   Moderate:  Frequent suicidal ideation with limited intensity, and duration, some specificity in terms of plans, no associated intent, good self-control, limited dysphoria/symptomatology, some risk factors present, and identifiable protective factors, including available and accessible social support.  PLAN OF CARE: Patient will be admitted to inpatient psychiatric unit for stabilization and safety. Will provide and encourage milieu participation. Provide medication management and maked adjustments as needed.  Will follow daily.    I certify that inpatient services furnished can reasonably be expected to improve the patient's condition.  Darral Rishel 02/11/2014, 4:40 PM

## 2014-02-11 NOTE — [facility] Group Notes (Signed)
The focus of this group is to educate the patient on the purpose and policies of crisis stabilization and provide a format to answer questions about their admission.  The group details unit policies and expectations of patients while admitted.  Patient attended 0900 nurse education orientation group this morning.  Patient actively participated, appropriate affect, alert, appropriate insight and engagement.  Today patient will work on 3 goals for discharge.  
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
## 2014-02-11 NOTE — [facility] Group Notes (Signed)
Adult Psychoeducational Group Note  Date:  02/11/2014 Time:  11:12 PM  Group Topic/Focus:  Wrap-Up Group:   The focus of this group is to help patients review their daily goal of treatment and discuss progress on daily workbooks.  Participation Level:  Active  Participation Quality:  Appropriate  Affect:  Appropriate  Cognitive:  Appropriate  Insight: Appropriate  Engagement in Group:  Engaged  Modes of Intervention:  Discussion  Additional Comments:  Katelyn [name] stated her day was a lot better than the last few days.  She met with the doctor and had her medications adjusted.    Victorino Sparrow A 02/11/2014, 11:12 PM

## 2014-02-11 NOTE — Progress Notes (Signed)
Patient ID: Jake ChurchChrista Gair, female   DOB: 02/18/1971, 43 y.o.   MRN: [id number] She has been up and to groups interacting with peers and staff. Has not requested any prn medications. Self inventory this AM.:depression7,hopelessness 8, anxiety 7, indicated that she was agitated, irritable and sucidal  sometimes  (Contracts for safety). She has not c/o any pain. Goal: change irritating  Thought. By jounaling  And work on Secretary/administratoraffirmations.
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
## 2014-02-11 NOTE — Progress Notes (Signed)
Vol admit, 43 yo caucasian female, requesting help in med adjustment.  Pt reports she has been engaging in risky behaviors in the last two months, behaviors that she is ashamed of.  She has been thinking about what she has done to the point of having suicidal thoughts. Pt did not divulge any details of her behaviors during the admission process.  She is afraid that this activity may continue if she does not get help.  Pt says she still has passive SI, but she can contract for safety with Clinical research associatewriter.  Pt denies HI/AV.  Pt denies any major medical issues.  Pt denies ETOH or drug use.  Pt states she has an 938 yo son who is staying with his father while she is in the hospital.  Pt was pleasant/cooperative with the admission process.  Paperwork signed and search completed.  Pt oriented to unit/room.  Safety checks q15 minutes initiated.
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
## 2014-02-12 MED ORDER — NITROFURANTOIN MONOHYD MACRO 100 MG PO CAPS
100.0000 mg | ORAL_CAPSULE | Freq: Two times a day (BID) | ORAL | Status: DC
  Administered 2014-02-12 – 2014-02-14 (×5): 100 mg via ORAL
  Filled 2014-02-12 (×9): qty 1

## 2014-02-12 MED ORDER — TRAZODONE HCL 50 MG PO TABS
50.0000 mg | ORAL_TABLET | Freq: Every day | ORAL | Status: DC
  Administered 2014-02-12 – 2014-02-13 (×2): 50 mg via ORAL
  Filled 2014-02-12 (×5): qty 1

## 2014-02-12 MED ORDER — DULOXETINE HCL 30 MG PO CPEP
30.0000 mg | ORAL_CAPSULE | Freq: Every day | ORAL | Status: DC
  Administered 2014-02-13 – 2014-02-14 (×2): 30 mg via ORAL
  Filled 2014-02-12 (×4): qty 1

## 2014-02-12 MED ORDER — ZIPRASIDONE HCL 20 MG PO CAPS
20.0000 mg | ORAL_CAPSULE | Freq: Every morning | ORAL | Status: DC
  Administered 2014-02-12 – 2014-02-13 (×2): 20 mg via ORAL
  Filled 2014-02-12 (×6): qty 1

## 2014-02-12 NOTE — Progress Notes (Addendum)
[facility] MD Progress Note  02/12/2014 3:07 PM Claudeen Leason  MRN:  [id number] Subjective:   Patient reports some improvement. She still feels quite depressed, but to a lesser degree than upon admission. She states she continues to struggle with a sense of shame/guilt, but states that meeting with staff/ chaplain has been helpful in " looking at things from a different perspective". Objective: I have discussed case with treatment team and have met with patient. With patient's express consent and in her presence I have spoken with her outpatient psychiatrist, Dr. Hermina Barters at [facility]. Patient has been going to  Groups and is visible in milieu. She remains depressed, but acknowledges some improvement and today she does present with a less severely constricted affect. She denies suicidal ideations or psychotic symptoms at present. She continues to feels some internal " agitation" and a subjective sense of hypersexuality, but to lesser degree than upon admission, and states she feels " calmer". Thus far tolerating lithium well. We discussed treatment options with patient and Dr. Hermina Barters- patient has not tolerated Lamictal well in the past. Abilify also caused some side effects ( EPS? Some pain).  We agreed to start tapering cymbalta and trazodone as antidepressants that could potentially worsen bipolarity or cause hypomania. We agreed on Geodon trial, which I will start at low dose , to minimize risk of side effects. Diagnosis:   Bipolar Disorder , Mixed   Total Time spent with patient: 25 minutes     ADL's:  Improved   Sleep: good   Appetite: Fair   Suicidal Ideation:  Denies any suicidal ideation at present  Homicidal Ideation:  Denies  AEB (as evidenced by):  Psychiatric Specialty Exam: Physical Exam  Review of Systems  Constitutional: Negative for fever and chills.  Respiratory: Negative for cough and shortness of breath.   Cardiovascular: Negative for chest pain.   Gastrointestinal: Negative for nausea and vomiting.  Psychiatric/Behavioral: Positive for depression. Negative for suicidal ideas and substance abuse.    Blood pressure 103/62, pulse 99, temperature 98.5 F (36.9 C), temperature source Oral, resp. rate 18, height 5' 2.75" (1.594 m), weight 90.266 kg (199 lb), last menstrual period 02/07/2014.Body mass index is 35.53 kg/(m^2).  General Appearance: improved grooming   Eye Contact::  Good  Speech:  Normal Rate  Volume:  Normal  Mood:  Depressed- but improving   Affect:  Constricted and but a little more reactive   Thought Process:  Linear  Orientation:  Full (Time, Place, and Person)  Thought Content:  denies hallucinations and does not appear internally preoccupied, no delusions expressed   Suicidal Thoughts:  No- denies any suicidal plan or intention at this time.   Homicidal Thoughts:  No  Memory:  recent and remote grossly intact   Judgement:  Other:  improved   Insight:  Good  Psychomotor Activity:  Normal  Concentration:  Good  Recall:  Good  Fund of Knowledge:Good  Language: NA  Akathisia:  Negative  Handed:  Right  AIMS (if indicated):     Assets:  Communication Skills Desire for Improvement Resilience Vocational/Educational  Sleep:  Number of Hours: 6.75   Musculoskeletal: Strength & Muscle Tone: within normal limits Gait & Station: normal Patient leans: N/A  Current Medications: Current Facility-Administered Medications  Medication Dose Route Frequency Provider Last Rate Last Dose  . acetaminophen (TYLENOL) tablet 650 mg  650 mg Oral Q6H PRN Laverle Hobby, PA-C      . ALPRAZolam (XANAX XR) 24 hr tablet 1  mg  1 mg Oral Daily Laverle Hobby, PA-C   1 mg at 02/12/14 0756  . alum & mag hydroxide-simeth (MAALOX/MYLANTA) 200-200-20 MG/5ML suspension 30 mL  30 mL Oral Q4H PRN Laverle Hobby, PA-C      . DULoxetine (CYMBALTA) DR capsule 60 mg  60 mg Oral Daily Laverle Hobby, PA-C   60 mg at 02/12/14 0756  . [START  ON 02/16/2014] etanercept (ENBREL) 50 MG/ML injection 50 mg  50 mg Subcutaneous Weekly Neita Garnet, MD      . Influenza vac split quadrivalent PF (FLUARIX) injection 0.5 mL  0.5 mL Intramuscular Tomorrow-1000 Neita Garnet, MD      . lithium carbonate capsule 300 mg  300 mg Oral q morning - 10a Laverle Hobby, PA-C   300 mg at 02/12/14 0758  . lithium carbonate capsule 600 mg  600 mg Oral QHS Laverle Hobby, PA-C   600 mg at 02/11/14 2211  . loratadine (CLARITIN) tablet 10 mg  10 mg Oral Daily Laverle Hobby, PA-C   10 mg at 02/12/14 0756  . magnesium hydroxide (MILK OF MAGNESIA) suspension 30 mL  30 mL Oral Daily PRN Laverle Hobby, PA-C      . nitrofurantoin (macrocrystal-monohydrate) (MACROBID) capsule 100 mg  100 mg Oral Q12H Laverle Hobby, PA-C   100 mg at 02/12/14 0757  . norgestimate-ethinyl estradiol (ORTHO-CYCLEN,SPRINTEC,PREVIFEM) 0.25-35 MG-MCG tablet 1 tablet  1 tablet Oral QHS Maurine Minister Simon, PA-C      . SUMAtriptan (IMITREX) tablet 100 mg  100 mg Oral PRN Laverle Hobby, PA-C      . topiramate (TOPAMAX) tablet 50 mg  50 mg Oral QHS Laverle Hobby, PA-C   50 mg at 02/11/14 2211  . traZODone (DESYREL) tablet 100 mg  100 mg Oral QHS Laverle Hobby, PA-C   100 mg at 02/11/14 2211    Lab Results:  Results for orders placed during the hospital encounter of 02/11/14 (from the past 48 hour(s))  CBC     Status: Abnormal   Collection Time    02/11/14  6:35 AM      Result Value Ref Range   WBC 11.0 (*) 4.0 - 10.5 K/uL   RBC 4.36  3.87 - 5.11 MIL/uL   Hemoglobin 14.0  12.0 - 15.0 g/dL   HCT 40.3  36.0 - 46.0 %   MCV 92.4  78.0 - 100.0 fL   MCH 32.1  26.0 - 34.0 pg   MCHC 34.7  30.0 - 36.0 g/dL   RDW 12.5  11.5 - 15.5 %   Platelets 279  150 - 400 K/uL   Comment: Performed at [place] PANEL     Status: Abnormal   Collection Time    02/11/14  6:35 AM      Result Value Ref Range   Sodium 137  137 - 147 mEq/L   Potassium 3.8   3.7 - 5.3 mEq/L   Chloride 98  96 - 112 mEq/L   CO2 24  19 - 32 mEq/L   Glucose, Bld 117 (*) 70 - 99 mg/dL   BUN 10  6 - 23 mg/dL   Creatinine, Ser 0.88  0.50 - 1.10 mg/dL   Calcium 9.1  8.4 - 10.5 mg/dL   Total Protein 6.5  6.0 - 8.3 g/dL   Albumin 3.4 (*) 3.5 - 5.2 g/dL   AST 29  0 - 37 U/L   ALT 22  0 - 35 U/L   Alkaline Phosphatase 63  39 - 117 U/L   Total Bilirubin 0.5  0.3 - 1.2 mg/dL   GFR calc non Af Amer 81 (*) >90 mL/min   GFR calc Af Amer >90  >90 mL/min   Comment: (NOTE)     The eGFR has been calculated using the CKD EPI equation.     This calculation has not been validated in all clinical situations.     eGFR's persistently <90 mL/min signify possible Chronic Kidney     Disease.   Anion gap 15  5 - 15   Comment: Performed at [facility]  LIPID PANEL     Status: Abnormal   Collection Time    02/11/14  6:35 AM      Result Value Ref Range   Cholesterol 240 (*) 0 - 200 mg/dL   Triglycerides 190 (*) <150 mg/dL   HDL 42  >39 mg/dL   Total CHOL/HDL Ratio 5.7     VLDL 38  0 - 40 mg/dL   LDL Cholesterol 160 (*) 0 - 99 mg/dL   Comment:            Total Cholesterol/HDL:CHD Risk     Coronary Heart Disease Risk Table                         Men   Women      1/2 Average Risk   3.4   3.3      Average Risk       5.0   4.4      2 X Average Risk   9.6   7.1      3 X Average Risk  23.4   11.0                Use the calculated Patient Ratio     above and the CHD Risk Table     to determine the patient's CHD Risk.                ATP III CLASSIFICATION (LDL):      <100     mg/dL   Optimal      100-129  mg/dL   Near or Above                        Optimal      130-159  mg/dL   Borderline      160-189  mg/dL   High      >190     mg/dL   Very High     Performed at [facility]  TSH     Status: None   Collection Time    02/11/14  6:35 AM      Result Value Ref Range   TSH 3.830  0.350 - 4.500 uIU/mL   Comment: Performed at [place]     Status: None   Collection Time    02/11/14  6:35 AM      Result Value Ref Range   Lithium Lvl 0.98  0.80 - 1.40 mEq/L   Comment: Performed at [place], URINE     Status: None   Collection Time    02/11/14  7:03 AM      Result Value Ref Range   Preg Test, Ur NEGATIVE  NEGATIVE   Comment:  THE SENSITIVITY OF THIS     METHODOLOGY IS >20 mIU/mL.     Performed at [place] MICROSCOPIC     Status: Abnormal   Collection Time    02/11/14  7:03 AM      Result Value Ref Range   Color, Urine YELLOW  YELLOW   APPearance CLOUDY (*) CLEAR   Specific Gravity, Urine 1.011  1.005 - 1.030   pH 5.5  5.0 - 8.0   Glucose, UA NEGATIVE  NEGATIVE mg/dL   Hgb urine dipstick LARGE (*) NEGATIVE   Bilirubin Urine NEGATIVE  NEGATIVE   Ketones, ur NEGATIVE  NEGATIVE mg/dL   Protein, ur NEGATIVE  NEGATIVE mg/dL   Urobilinogen, UA 0.2  0.0 - 1.0 mg/dL   Nitrite NEGATIVE  NEGATIVE   Leukocytes, UA MODERATE (*) NEGATIVE   Comment: Performed at [place] [facility]     Status: Abnormal   Collection Time    02/11/14  7:03 AM      Result Value Ref Range   Opiates NONE DETECTED  NONE DETECTED   Cocaine NONE DETECTED  NONE DETECTED   Benzodiazepines POSITIVE (*) NONE DETECTED   Amphetamines NONE DETECTED  NONE DETECTED   Tetrahydrocannabinol NONE DETECTED  NONE DETECTED   Barbiturates NONE DETECTED  NONE DETECTED   Comment:            DRUG SCREEN FOR MEDICAL PURPOSES     ONLY.  IF CONFIRMATION IS NEEDED     FOR ANY PURPOSE, NOTIFY LAB     WITHIN 5 DAYS.                LOWEST DETECTABLE LIMITS     FOR URINE DRUG SCREEN     Drug Class       Cutoff (ng/mL)     Amphetamine      1000     Barbiturate      200     Benzodiazepine   536     Tricyclics       468     Opiates          300     Cocaine          300     THC              50      Performed at [place] ON     Status: Abnormal   Collection Time    02/11/14  7:03 AM      Result Value Ref Range   Squamous Epithelial / LPF FEW (*) RARE   WBC, UA 21-50  <3 WBC/hpf   RBC / HPF 0-2  <3 RBC/hpf   Bacteria, UA MANY (*) RARE   Comment: Performed at [facility]    Physical Findings: AIMS: Facial and Oral Movements Muscles of Facial Expression: None, normal Lips and Perioral Area: None, normal Jaw: None, normal Tongue: None, normal,Extremity Movements Upper (arms, wrists, hands, fingers): None, normal Lower (legs, knees, ankles, toes): None, normal, Trunk Movements Neck, shoulders, hips: None, normal, Overall Severity Severity of abnormal movements (highest score from questions above): None, normal Incapacitation due to abnormal movements: None, normal Patient's awareness of abnormal movements (rate only patient's report): No Awareness, Dental Status Current problems with teeth and/or dentures?: No Does patient usually wear dentures?: No  CIWA:    COWS:  Assessment: Patient is improved compared to yesterday, although still depressed and ruminative. She is not suicidal. She continues to experience some subjective agitation and hypersexuality, but states it is better, and her behavior has been calm and in good control. Active and appropriately participating in milieu. Tolerating Lithium well and lithium level therapeutic. As discussed with patient and her psychiatrist, Dr. Hermina Barters, will taper antidepressant due to bipolar presentation, and concern of these medications inducing hypomania or rapid cycling. Will add Geodon, will start at low dose as history of side effects on Abilify in the past.  As noted, we also discussed Lamictal as an option but patient did not tolerate it well.  Treatment Plan Summary: Daily contact with patient to assess and evaluate symptoms and progress in treatment Medication  management See below   Plan: Continue inpatient treatment and continue to provide milieu/support. Continue LiCO3-  300 mgrs QAM and 600 mgrs QHS Taper Cymbalta to 30 mgrs QDAY Taper Trazodone to 50 mgrs QHS  Start Geodon 20 mgrs QDAY initially U/A abnormal, possible contamination? Patient not describing symptoms- will order UCx  Medical Decision Making Problem Points:  Established problem, stable/improving (1), Review of last therapy session (1) and Review of psycho-social stressors (1) Data Points:  Review or order clinical lab tests (1) Review of medication regiment & side effects (2) Review of new medications or change in dosage (2)  I certify that inpatient services furnished can reasonably be expected to improve the patient's condition.   Rosendo Couser, [place] 02/12/2014, 3:07 PM

## 2014-02-12 NOTE — Progress Notes (Signed)
Adult Psychoeducational Group Note  Date:  02/12/2014 Time:  2:45 pm  Group Topic/Focus:  Wellness Toolbox:   The focus of this group is to discuss various aspects of wellness, balancing those aspects and exploring ways to increase the ability to experience wellness.  Patients will create a wellness toolbox for use upon discharge.  Participation Level:  Active  Participation Quality:  Appropriate, Sharing and Supportive  Affect:  Appropriate  Cognitive:  Appropriate  Insight: Appropriate  Engagement in Group:  Engaged  Modes of Intervention:  Discussion, Education, Socialization and Support  Additional Comments: Pt attended group but was pulled out by the doctor and was unable to share any information in relationship to the exercise.  Katelyn [name] 02/12/2014, 6:11 PM
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
## 2014-02-12 NOTE — Progress Notes (Signed)
Pt reports she is doing better than last night when she was admitted.  Pt denies SI/HI/AV at this time, although she says SI comes and goes.  Pt reports she has attended groups today.  Pt reports that the MD changed some of her meds and she is hopeful that this is what she needs to stay out of the manic state that brought her into the hospital.  Pt makes her needs known to staff.  EKG was completed on pt this evening per MD order.  Support and encouragement offered.  Pt voiced no needs or concerns.  Safety maintained with q15 minute checks.

## 2014-02-12 NOTE — Progress Notes (Signed)
Patient is calm, cooperative. Reports attending all groups and having "a much better day today". Denies SI, HI, AVH. Rates anxiety 2/10, and feelings of depression 4/10.  Encouragement offered.  Q 15 safety checks continue.

## 2014-02-12 NOTE — Progress Notes (Signed)
Pt attended NA group this evening.  

## 2014-02-12 NOTE — B[facility] Group Notes (Signed)
   [facility] Dis[facility] Note  02/12/2014  8:45 AM   Participation Quality: Alert, Appropriate and Oriented  Mood/Affect: Depressed and Flat  Depression Rating: 5  Anxiety Rating: 5  Thoughts of Suicide: Pt denies SI/HI  Will you contract for safety? Yes  Current AVH: Pt denies  Plan for Discharge/Comments: Pt attended discharge planning group and actively participated in group. CSW provided pt with today's workbook. Patient reports that she is feeling "better" since admission. She plans to return home to [facility]   Transportation Means: Pt reports access to transportation  Supports: No supports mentioned at this time  Samuella BruinKristin Nevah Dalal, MSW, [employer]CSWA Clinical Social Worker [facility] [phone]

## 2014-02-12 NOTE — Progress Notes (Signed)
Pt has been up and active in the milieu today.  She rated all her depression hopelessness and anxiety a 5 on her self-inventory. She denied any S/H ideation or A/V/H.  Her goal today "focus on building self-esteem". She does have a UTI and has started on antibiotic. No complaints voiced.

## 2014-02-12 NOTE — Progress Notes (Signed)
Chaplain follow up with pt.  Katelyn [name] expresses improvement in mood.  Feeling more hopeful about medication management.  It was helpful for her to hear from SW that she is modeling good coping skills for her son.

## 2014-02-13 LAB — URINE CULTURE
Colony Count: NO GROWTH
Culture: NO GROWTH
SPECIAL REQUESTS: NORMAL

## 2014-02-13 MED ORDER — ZIPRASIDONE HCL 40 MG PO CAPS
40.0000 mg | ORAL_CAPSULE | Freq: Every morning | ORAL | Status: DC
  Administered 2014-02-14: 40 mg via ORAL
  Filled 2014-02-13 (×3): qty 1

## 2014-02-13 NOTE — Progress Notes (Signed)
Patient ID: Katelyn [name], female   DOB: [birth year], 43 y.o.   MRN: [id number] [facility] MD Progress Note  02/13/2014 5:05 PM Jen Evatt  MRN:  [id number] Subjective:   Patient states that she is " better". She does report some ongoing depression, anxiety, but states she is feeling better about herself, " coming to terms" with recent manic episode, and feeling less guilty and ashamed. She is starting to focus more on discharge issues, and states " I really want to see my son soon" Objective:  Behavior on unit in good spirits. No disruptive behaviors. Still depressed, but affect more reactive, and currently no symptoms of mania or hypomania. The  Subjectively anxiety provokingsensation  Of activation and hypersexuality that she reported upon admission has resolved. She denies medication side effects except for mild nausea and " queasiness", although denies vomiting and has been able to eat her meals without difficulties. She attributes these GI symptoms to Antibiotic ( being treated for UTI) rather than to Geodon ( new medication for her) Denies any suicidal ideations. No psychotic symptoms. As per chart notes, has been active in milieu, still presenting with some depression. Diagnosis:   Bipolar Disorder , Mixed   Total Time spent with patient: 20 minutes    ADL's:  Improved   Sleep: Sleeping better   Appetite: Fair   Suicidal Ideation:  Denies any suicidal ideation at present  Homicidal Ideation:  Denies  AEB (as evidenced by):  Psychiatric Specialty Exam: Physical Exam  Review of Systems  Constitutional: Negative for fever and chills.  Respiratory: Negative for cough and shortness of breath.   Cardiovascular: Negative for chest pain.  Gastrointestinal: Negative for nausea and vomiting.  Psychiatric/Behavioral: Positive for depression. Negative for suicidal ideas and substance abuse.    Blood pressure 100/48, pulse 93, temperature 98.2 F (36.8 C), temperature source Oral,  resp. rate 16, height 5' 2.75" (1.594 m), weight 90.266 kg (199 lb), last menstrual period 02/07/2014.Body mass index is 35.53 kg/(m^2).  General Appearance: improved grooming   Eye Contact::  Good  Speech:  Normal Rate  Volume:  Normal  Mood: remains depressed, at this time no symptoms of  "mixed" mood episode noted or reported   Affect:  Constricted and but a little more reactive   Thought Process:  Linear  Orientation:  Full (Time, Place, and Person)  Thought Content:  denies hallucinations and does not appear internally preoccupied, no delusions expressed   Suicidal Thoughts:  No- denies any suicidal plan or intention at this time.   Homicidal Thoughts:  No  Memory:  recent and remote grossly intact   Judgement:  Other:  improved   Insight:  Good  Psychomotor Activity:  Normal  Concentration:  Good  Recall:  Good  Fund of Knowledge:Good  Language: NA  Akathisia:  Negative  Handed:  Right  AIMS (if indicated):     Assets:  Communication Skills Desire for Improvement Resilience Vocational/Educational  Sleep:  Number of Hours: 6.75   Musculoskeletal: Strength & Muscle Tone: within normal limits Gait & Station: normal Patient leans: N/A  Current Medications: Current Facility-Administered Medications  Medication Dose Route Frequency Provider Last Rate Last Dose  . acetaminophen (TYLENOL) tablet 650 mg  650 mg Oral Q6H PRN Kerry Hough, PA-C      . ALPRAZolam (XANAX XR) 24 hr tablet 1 mg  1 mg Oral Daily Kerry Hough, PA-C   1 mg at 02/13/14 4010  . alum & mag hydroxide-simeth (MAALOX/MYLANTA) 200-200-20 MG/5ML suspension  30 mL  30 mL Oral Q4H PRN Kerry Hough, PA-C      . DULoxetine (CYMBALTA) DR capsule 30 mg  30 mg Oral Daily Nehemiah Massed, MD   30 mg at 02/13/14 0806  . [START ON 02/16/2014] etanercept (ENBREL) 50 MG/ML injection 50 mg  50 mg Subcutaneous Weekly Nehemiah Massed, MD      . Influenza vac split quadrivalent PF (FLUARIX) injection 0.5 mL  0.5 mL  Intramuscular Tomorrow-1000 Nehemiah Massed, MD      . lithium carbonate capsule 300 mg  300 mg Oral q morning - 10a Kerry Hough, PA-C   300 mg at 02/13/14 1104  . lithium carbonate capsule 600 mg  600 mg Oral QHS Kerry Hough, PA-C   600 mg at 02/12/14 2204  . loratadine (CLARITIN) tablet 10 mg  10 mg Oral Daily Kerry Hough, PA-C   10 mg at 02/13/14 1610  . magnesium hydroxide (MILK OF MAGNESIA) suspension 30 mL  30 mL Oral Daily PRN Kerry Hough, PA-C      . nitrofurantoin (macrocrystal-monohydrate) (MACROBID) capsule 100 mg  100 mg Oral Q12H Kerry Hough, PA-C   100 mg at 02/13/14 0806  . norgestimate-ethinyl estradiol (ORTHO-CYCLEN,SPRINTEC,PREVIFEM) 0.25-35 MG-MCG tablet 1 tablet  1 tablet Oral QHS Kerry Hough, PA-C   1 tablet at 02/12/14 2206  . SUMAtriptan (IMITREX) tablet 100 mg  100 mg Oral PRN Kerry Hough, PA-C      . topiramate (TOPAMAX) tablet 50 mg  50 mg Oral QHS Kerry Hough, PA-C   50 mg at 02/12/14 2203  . traZODone (DESYREL) tablet 50 mg  50 mg Oral QHS Nehemiah Massed, MD   50 mg at 02/12/14 2205  . ziprasidone (GEODON) capsule 20 mg  20 mg Oral q morning - 10a Nehemiah Massed, MD   20 mg at 02/13/14 1104    Lab Results:  No results found for this or any previous visit (from the past 48 hour(s)).  Physical Findings: AIMS: Facial and Oral Movements Muscles of Facial Expression: None, normal Lips and Perioral Area: None, normal Jaw: None, normal Tongue: None, normal,Extremity Movements Upper (arms, wrists, hands, fingers): None, normal Lower (legs, knees, ankles, toes): None, normal, Trunk Movements Neck, shoulders, hips: None, normal, Overall Severity Severity of abnormal movements (highest score from questions above): None, normal Incapacitation due to abnormal movements: None, normal Patient's awareness of abnormal movements (rate only patient's report): No Awareness, Dental Status Current problems with teeth and/or dentures?: No Does patient  usually wear dentures?: No  CIWA:    COWS:     Assessment: Patient is still depressed, but improved compared to admission. Not suicidal and currently not presenting with mixed symptoms. Tolerating Lithium and Geodon ( which was started at a low dose) well thus far. No suicidal ideations, not psychotic, behavior in good control.  Treatment Plan Summary: Daily contact with patient to assess and evaluate symptoms and progress in treatment Medication management See below   Plan: Continue inpatient treatment and continue to provide milieu/support. Continue LiCO3-  300 mgrs QAM and 600 mgrs QHS Cymbalta 30 mgrs QDAY Trazodone  50 mgrs QHS  Increase Geodon to 40  mgrs QDAY    Medical Decision Making Problem Points:  Established problem, stable/improving (1), Review of last therapy session (1) and Review of psycho-social stressors (1) Data Points:  Review of medication regiment & side effects (2) Review of new medications or change in dosage (2)  I certify that inpatient  services furnished can reasonably be expected to improve the patient's condition.   Justis Dupas 02/13/2014, 5:05 PM

## 2014-02-13 NOTE — [facility] Suicide Risk Assessment (Signed)
[facility] INPATIENT:  Family/Significant Other Suicide Prevention Education  Suicide Prevention Education:  Education Completed; Sister Reggie PileSonya Sawyer [phone],  (name of family member/significant other) has been identified by the patient as the family member/significant other with whom the patient will be residing, and identified as the person(s) who will aid the patient in the event of a mental health crisis (suicidal ideations/suicide attempt).  With written consent from the patient, the family member/significant other has been provided the following suicide prevention education, prior to the and/or following the discharge of the patient.  The suicide prevention education provided includes the following:  Suicide risk factors  Suicide prevention and interventions  National Suicide Hotline telephone number  [facility] assessment telephone number  [facility] Emergency Assistance 911  [facility] and/or Residential Mobile Crisis Unit telephone number  Request made of family/significant other to:  Remove weapons (e.g., guns, rifles, knives), all items previously/currently identified as safety concern.    Remove drugs/medications (over-the-counter, prescriptions, illicit drugs), all items previously/currently identified as a safety concern.  The family member/significant other verbalizes understanding of the suicide prevention education information provided.  The family member/significant other agrees to remove the items of safety concern listed above.  Ahmari Duerson, [place] CarboKristin L 02/13/2014, 12:30 PM

## 2014-02-13 NOTE — Progress Notes (Signed)
D: Pt presents with flat affect and depressed mood. Pt observed sleeping more often today.  Pt reported decreasing depression 5/10. Pt rates anxiety and hopeless 5/10. Pt reported that she slept well but her energy level is low today. Pt denies SI/HI/AVH. Pt have attended some groups and is compliant with taking meds.  A: Medications administered as ordered per MD. Verbal support given. Pt encouraged to attend groups. 15 minute checks performed for safety.  R: Pt safety maintained at this time. Pt receptive to treatment,

## 2014-02-13 NOTE — Progress Notes (Signed)
Patient ID: [name], female   DOB: 08/20/1970, 43 y.o.   MRN: [id number] D: Client reports depression as "4" of 10, reports groups have been helpful, "talking, helped me to understand the decisions I made when I was manic was the disease, not me"  Reports no nausea since lunch, been eating saltines, drinking ginger-ale.  A: Writer provided emotional support, encouraged client to report any concerns she may have. Staff will monitor q9315min for safety. R: Client is safe on the unit, attended group.
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
## 2014-02-13 NOTE — [facility] Group Notes (Signed)
[facility] LCSW Group Therapy 02/13/2014  1:15 pm   Type of Therapy: Group Therapy Participation Level: Active  Participation Quality: Attentive, Sharing and Supportive  Affect: Depressed and Flat  Cognitive: Alert and Oriented  Insight: Developing/Improving and Engaged  Engagement in Therapy: Developing/Improving and Engaged  Modes of Intervention: Clarification, Confrontation, Discussion, Education, Exploration, Limit-setting, Orientation, Problem-solving, Rapport Building, Dance movement psychotherapisteality Testing, Socialization and Support  Summary of Progress/Problems: The topic for group was balance in life. Today's group focused on defining balance in one's own words, identifying things that can knock one off balance, and exploring healthy ways to maintain balance in life. Group members were asked to provide an example of a time when they felt off balance, describe how they handled that situation,and process healthier ways to regain balance in the future. Group members were asked to share the most important tool for maintaining balance that they learned [facility] and how they plan to apply this method after discharge. Patient discussed coping skills that she has used in the past to maintain balance in her life such as meditation, swimming, and journaling. Patient states that she plans on incorporating some of these things back into her life once returning home. CSW's and other group members provided emotional support and encouragement.    Samuella BruinKristin Fouad Taul, MSW, [employer]CSWA Clinical Social Worker [facility] [phone]

## 2014-02-14 DIAGNOSIS — F411 Generalized anxiety disorder: Secondary | ICD-10-CM

## 2014-02-14 MED ORDER — ZIPRASIDONE HCL 40 MG PO CAPS: 40.0000 mg | ORAL_CAPSULE | Freq: Every morning | ORAL | Status: DC

## 2014-02-14 MED ORDER — LITHIUM CARBONATE 600 MG PO CAPS: 600.0000 mg | ORAL_CAPSULE | Freq: Every day | ORAL | Status: DC

## 2014-02-14 MED ORDER — TOPIRAMATE 50 MG PO TABS: 50.0000 mg | ORAL_TABLET | Freq: Every day | ORAL | Status: DC

## 2014-02-14 MED ORDER — DULOXETINE HCL 30 MG PO CPEP: 30.0000 mg | ORAL_CAPSULE | Freq: Every day | ORAL | Status: DC

## 2014-02-14 MED ORDER — TRAZODONE HCL 50 MG PO TABS: 50.0000 mg | ORAL_TABLET | Freq: Every day | ORAL | Status: DC

## 2014-02-14 MED ORDER — NITROFURANTOIN MONOHYD MACRO 100 MG PO CAPS: 100.0000 mg | ORAL_CAPSULE | Freq: Two times a day (BID) | ORAL | Status: DC

## 2014-02-14 MED ORDER — ETANERCEPT 50 MG/ML ~~LOC~~ SOLN: 50.0000 mg | SUBCUTANEOUS | Status: DC

## 2014-02-14 MED ORDER — NORGESTIMATE-ETH ESTRADIOL 0.25-35 MG-MCG PO TABS: 1.0000 | ORAL_TABLET | Freq: Every day | ORAL | Status: DC

## 2014-02-14 MED ORDER — SUMATRIPTAN SUCCINATE 100 MG PO TABS: 100.0000 mg | ORAL_TABLET | ORAL | Status: DC | PRN

## 2014-02-14 MED ORDER — LITHIUM CARBONATE 300 MG PO CAPS: 300.0000 mg | ORAL_CAPSULE | Freq: Every morning | ORAL | Status: DC
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
## 2014-02-14 NOTE — Progress Notes (Signed)
Adult Psychoeducational Group Note  Date:  02/14/2014 Time:  10:53 AM  Group Topic/Focus:  Therapeutic Activity  Participation Level:  Active  Participation Quality:  Appropriate and Sharing  Affect:  Appropriate  Cognitive:  Appropriate  Insight: Appropriate  Engagement in Group:  Engaged  Modes of Intervention:  Activity, Discussion and Socialization    Elijio MilesMercer, Ashaunte Standley N 02/14/2014, 10:53 AM
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
## 2014-02-14 NOTE — Plan of Care (Signed)
Problem: Diagnosis: Increased Risk For Suicide Attempt Goal: STG-Patient Will Report Suicidal Feelings to Staff Outcome: Progressing Pt has denied any suicidal ideations and verbally contracts to come to staff.

## 2014-02-14 NOTE — [facility] Group Notes (Signed)
   [facility] Note  02/14/2014  8:45 AM   Participation Quality: Alert, Appropriate and Oriented  Mood/Affect: Depressed and Flat  Depression Rating: 3  Anxiety Rating: 2  Thoughts of Suicide: Pt denies SI/HI  Will you contract for safety? Yes  Current AVH: Pt denies  Plan for Discharge/Comments: Pt attended discharge planning group and actively participated in group. CSW provided pt with today's workbook. Patient reports that she feels ready to discharge today to return home and follow up with her outpatient providers.  Transportation Means: Pt reports access to transportation  Supports: Patient has identified her sister as a strong support.  Samuella BruinKristin Candy Leverett, MSW, [employer]CSWA Clinical Social Worker [facility] [phone]

## 2014-02-14 NOTE — Progress Notes (Signed)
[facility] Discharge Plan :  Will you be returning to the same living situation after discharge: Yes,  patient will return home At discharge, do you have transportation home?:Yes,  patient has her own transportation Do you have the ability to pay for your medications:Yes,  patient will be provided prescriptions at discharge and verbalizes her ability to obtain her medications.  Release of information consent forms completed and in the chart;  Patient's signature needed at discharge.  Patient to Follow up at: Follow-up Information   [facility] On 03/03/2014. (Please present for medication management appt with Dr. Adonis Housekeeperhotokura at 4 pm. Please call office if you need to reschedule.)    Contact information:   Address: [address], [place], [place] [address] [phone]      Follow up with [facility] On 02/20/2014. (Please present for therapy appt. with Verlan FriendsSara Young on this date at 1pm. Please call office if you need to reschedule.)    Contact information:   [address]  [place], [place] [address] [phone] Office [phone] Fax      Patient denies SI/HI:   Yes,  denies    Safety Planning and Suicide Prevention discussed:  Yes,  with patient and her sister  Johny ChessDrinkard, Pamila Mendibles L 02/14/2014, 10:46 AM

## 2014-02-14 NOTE — Progress Notes (Signed)
Pt was discharged home today. She denied any S/I H/I or A/V hallucinations.  She was given f/u appointment, rx, sample medications, hotline info booklet. She voiced understanding to all instructions provided. She declined the need for smoking cessation materials.
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
## 2014-02-14 NOTE — Progress Notes (Signed)
B[facility] Group Notes:  (Nursing/MHT/Case Management/Adjunct)  Date:  02/14/2014  Time:  1:05 PM  Type of Therapy:  Therapeutic Activity  Participation Level:  Active  Participation Quality:  Appropriate  Affect:  Appropriate   Meagon Duskin C 02/14/2014, 1:05 PM 

## 2014-02-19 NOTE — Progress Notes (Signed)
Patient Discharge Instructions:  After Visit Summary (AVS):   Faxed to:  02/19/14 Discharge Summary Note:   Faxed to:  02/19/14 Psychiatric Admission Assessment Note:   Faxed to:  02/19/14 Suicide Risk Assessment - Discharge Assessment:   Faxed to:  02/19/14 Faxed/Sent to the Next Level Care provider:  02/19/14 Faxed to [facility] @ [phone] Records sent via Mail to: [facility] [address] [place], [place] [address]  Jerelene ReddenSheena E [place], 02/19/2014, 2:25 PM

## 2014-04-25 ENCOUNTER — Ambulatory Visit (HOSPITAL_COMMUNITY)
Admission: AD | Admit: 2014-04-25 | Discharge: 2014-04-25 | Disposition: A | Discharge status: Home / Self Care | Payer: BC Managed Care – PPO | Attending: Psychiatry | Admitting: Psychiatry

## 2014-04-25 DIAGNOSIS — F4310 Post-traumatic stress disorder, unspecified: Secondary | ICD-10-CM | POA: Insufficient documentation

## 2014-04-25 DIAGNOSIS — F419 Anxiety disorder, unspecified: Secondary | ICD-10-CM | POA: Insufficient documentation

## 2014-04-25 DIAGNOSIS — G43909 Migraine, unspecified, not intractable, without status migrainosus: Secondary | ICD-10-CM | POA: Insufficient documentation

## 2014-04-25 DIAGNOSIS — M1990 Unspecified osteoarthritis, unspecified site: Secondary | ICD-10-CM | POA: Insufficient documentation

## 2014-04-25 DIAGNOSIS — F329 Major depressive disorder, single episode, unspecified: Secondary | ICD-10-CM | POA: Insufficient documentation

## 2014-04-26 ENCOUNTER — Emergency Department (HOSPITAL_COMMUNITY)
Admission: EM | Admit: 2014-04-26 | Discharge: 2014-04-26 | Disposition: A | Discharge status: Other Acute Inpatient Hospital | Payer: BLUE CROSS/BLUE SHIELD | Attending: Emergency Medicine | Admitting: Emergency Medicine

## 2014-04-26 ENCOUNTER — Encounter (HOSPITAL_COMMUNITY): Payer: Self-pay

## 2014-04-26 ENCOUNTER — Inpatient Hospital Stay (HOSPITAL_COMMUNITY)
Admission: AD | Admit: 2014-04-26 | Discharge: 2014-05-02 | DRG: 885 | Disposition: A | Discharge status: Home / Self Care | Payer: Federal, State, Local not specified - Other | Source: Intra-hospital | Attending: Psychiatry | Admitting: Psychiatry

## 2014-04-26 ENCOUNTER — Encounter (HOSPITAL_COMMUNITY): Payer: Self-pay | Admitting: Emergency Medicine

## 2014-04-26 DIAGNOSIS — G4700 Insomnia, unspecified: Secondary | ICD-10-CM | POA: Diagnosis present

## 2014-04-26 DIAGNOSIS — G4710 Hypersomnia, unspecified: Secondary | ICD-10-CM | POA: Diagnosis present

## 2014-04-26 DIAGNOSIS — F319 Bipolar disorder, unspecified: Secondary | ICD-10-CM | POA: Insufficient documentation

## 2014-04-26 DIAGNOSIS — Z79899 Other long term (current) drug therapy: Secondary | ICD-10-CM | POA: Diagnosis not present

## 2014-04-26 DIAGNOSIS — F411 Generalized anxiety disorder: Secondary | ICD-10-CM | POA: Diagnosis present

## 2014-04-26 DIAGNOSIS — F329 Major depressive disorder, single episode, unspecified: Secondary | ICD-10-CM | POA: Diagnosis present

## 2014-04-26 DIAGNOSIS — R45851 Suicidal ideations: Secondary | ICD-10-CM | POA: Insufficient documentation

## 2014-04-26 DIAGNOSIS — F32A Depression, unspecified: Secondary | ICD-10-CM | POA: Diagnosis present

## 2014-04-26 DIAGNOSIS — M1990 Unspecified osteoarthritis, unspecified site: Secondary | ICD-10-CM | POA: Diagnosis present

## 2014-04-26 DIAGNOSIS — F3130 Bipolar disorder, current episode depressed, mild or moderate severity, unspecified: Secondary | ICD-10-CM | POA: Diagnosis not present

## 2014-04-26 DIAGNOSIS — Z8739 Personal history of other diseases of the musculoskeletal system and connective tissue: Secondary | ICD-10-CM | POA: Insufficient documentation

## 2014-04-26 DIAGNOSIS — Z599 Problem related to housing and economic circumstances, unspecified: Secondary | ICD-10-CM | POA: Diagnosis not present

## 2014-04-26 DIAGNOSIS — F4310 Post-traumatic stress disorder, unspecified: Secondary | ICD-10-CM

## 2014-04-26 DIAGNOSIS — F314 Bipolar disorder, current episode depressed, severe, without psychotic features: Secondary | ICD-10-CM | POA: Insufficient documentation

## 2014-04-26 DIAGNOSIS — F410 Panic disorder [episodic paroxysmal anxiety] without agoraphobia: Secondary | ICD-10-CM | POA: Diagnosis present

## 2014-04-26 DIAGNOSIS — Z62810 Personal history of physical and sexual abuse in childhood: Secondary | ICD-10-CM | POA: Diagnosis present

## 2014-04-26 DIAGNOSIS — G43909 Migraine, unspecified, not intractable, without status migrainosus: Secondary | ICD-10-CM | POA: Diagnosis not present

## 2014-04-26 DIAGNOSIS — Z008 Encounter for other general examination: Secondary | ICD-10-CM | POA: Diagnosis present

## 2014-04-26 LAB — CBC
HCT: 41.8 % (ref 36.0–46.0)
Hemoglobin: 14.5 g/dL (ref 12.0–15.0)
MCH: 31.9 pg (ref 26.0–34.0)
MCHC: 34.7 g/dL (ref 30.0–36.0)
MCV: 92.1 fL (ref 78.0–100.0)
Platelets: 251 10*3/uL (ref 150–400)
RBC: 4.54 MIL/uL (ref 3.87–5.11)
RDW: 12.6 % (ref 11.5–15.5)
WBC: 11.5 10*3/uL — ABNORMAL HIGH (ref 4.0–10.5)

## 2014-04-26 LAB — COMPREHENSIVE METABOLIC PANEL
ALK PHOS: 64 U/L (ref 39–117)
ALT: 21 U/L (ref 0–35)
AST: 29 U/L (ref 0–37)
Albumin: 4.2 g/dL (ref 3.5–5.2)
Anion gap: 8 (ref 5–15)
BILIRUBIN TOTAL: 0.5 mg/dL (ref 0.3–1.2)
BUN: 8 mg/dL (ref 6–23)
CALCIUM: 9.5 mg/dL (ref 8.4–10.5)
CO2: 23 mmol/L (ref 19–32)
Chloride: 102 mEq/L (ref 96–112)
Creatinine, Ser: 0.65 mg/dL (ref 0.50–1.10)
GFR calc non Af Amer: >90 mL/min (ref >90)
GLUCOSE: 102 mg/dL — AB (ref 70–99)
Potassium: 3.3 mmol/L — ABNORMAL LOW (ref 3.5–5.1)
SODIUM: 133 mmol/L — AB (ref 135–145)
Total Protein: 7.4 g/dL (ref 6.0–8.3)

## 2014-04-26 LAB — RAPID URINE DRUG SCREEN, HOSP PERFORMED
AMPHETAMINES: NOT DETECTED
BARBITURATES: NOT DETECTED
Benzodiazepines: POSITIVE — AB
Cocaine: NOT DETECTED
OPIATES: NOT DETECTED
Tetrahydrocannabinol: NOT DETECTED

## 2014-04-26 LAB — ETHANOL

## 2014-04-26 LAB — ACETAMINOPHEN LEVEL: Acetaminophen (Tylenol), Serum: <10 ug/mL — ABNORMAL LOW (ref 10–30)

## 2014-04-26 LAB — SALICYLATE LEVEL: Salicylate Lvl: <4 mg/dL (ref 2.8–20.0)

## 2014-04-26 MED ORDER — POTASSIUM CHLORIDE CRYS ER 10 MEQ PO TBCR
10.0000 meq | EXTENDED_RELEASE_TABLET | Freq: Every day | ORAL | Status: DC
  Administered 2014-04-26: 10 meq via ORAL
  Filled 2014-04-26: qty 1

## 2014-04-26 MED ORDER — POTASSIUM CHLORIDE CRYS ER 20 MEQ PO TBCR
40.0000 meq | EXTENDED_RELEASE_TABLET | Freq: Once | ORAL | Status: AC
  Administered 2014-04-26: 40 meq via ORAL
  Filled 2014-04-26: qty 2

## 2014-04-26 MED ORDER — HYDROXYZINE HCL 25 MG PO TABS
25.0000 mg | ORAL_TABLET | Freq: Four times a day (QID) | ORAL | Status: DC | PRN
  Administered 2014-04-28 – 2014-05-01 (×2): 25 mg via ORAL
  Filled 2014-04-26: qty 1
  Filled 2014-04-26: qty 30
  Filled 2014-04-26 (×2): qty 1

## 2014-04-26 MED ORDER — ALUM & MAG HYDROXIDE-SIMETH 200-200-20 MG/5ML PO SUSP: 30.0000 mL | ORAL | Status: DC | PRN

## 2014-04-26 MED ORDER — ACETAMINOPHEN 325 MG PO TABS: 650.0000 mg | ORAL_TABLET | Freq: Four times a day (QID) | ORAL | Status: DC | PRN

## 2014-04-26 MED ORDER — HYDROXYZINE HCL 25 MG PO TABS
25.0000 mg | ORAL_TABLET | Freq: Four times a day (QID) | ORAL | Status: DC | PRN
  Administered 2014-04-26: 25 mg via ORAL
  Filled 2014-04-26: qty 1

## 2014-04-26 MED ORDER — ACETAMINOPHEN 325 MG PO TABS: 650.0000 mg | ORAL_TABLET | ORAL | Status: DC | PRN

## 2014-04-26 MED ORDER — MAGNESIUM HYDROXIDE 400 MG/5ML PO SUSP: 30.0000 mL | Freq: Every day | ORAL | Status: DC | PRN

## 2014-04-26 MED ORDER — LITHIUM CARBONATE 300 MG PO CAPS
300.0000 mg | ORAL_CAPSULE | Freq: Every morning | ORAL | Status: DC
  Administered 2014-04-26: 300 mg via ORAL
  Filled 2014-04-26: qty 1

## 2014-04-26 MED ORDER — DULOXETINE HCL 30 MG PO CPEP
30.0000 mg | ORAL_CAPSULE | Freq: Every day | ORAL | Status: DC
  Administered 2014-04-26: 30 mg via ORAL
  Filled 2014-04-26: qty 1

## 2014-04-26 MED ORDER — ZIPRASIDONE HCL 20 MG PO CAPS
40.0000 mg | ORAL_CAPSULE | Freq: Every day | ORAL | Status: DC
  Administered 2014-04-26: 40 mg via ORAL
  Filled 2014-04-26: qty 2

## 2014-04-26 MED ORDER — ZIPRASIDONE HCL 20 MG PO CAPS
40.0000 mg | ORAL_CAPSULE | Freq: Every morning | ORAL | Status: DC
  Filled 2014-04-26: qty 2

## 2014-04-26 MED ORDER — LORAZEPAM 1 MG PO TABS: 1.0000 mg | ORAL_TABLET | Freq: Three times a day (TID) | ORAL | Status: DC | PRN

## 2014-04-26 MED ORDER — TOPIRAMATE 25 MG PO TABS
50.0000 mg | ORAL_TABLET | Freq: Every day | ORAL | Status: DC
  Administered 2014-04-26: 50 mg via ORAL
  Filled 2014-04-26: qty 2

## 2014-04-26 MED ORDER — ONDANSETRON HCL 4 MG PO TABS: 4.0000 mg | ORAL_TABLET | Freq: Three times a day (TID) | ORAL | Status: DC | PRN

## 2014-04-26 MED ORDER — LITHIUM CARBONATE 300 MG PO CAPS
600.0000 mg | ORAL_CAPSULE | Freq: Every day | ORAL | Status: DC
  Administered 2014-04-26: 600 mg via ORAL
  Filled 2014-04-26: qty 2

## 2014-04-26 MED ORDER — TRAZODONE HCL 50 MG PO TABS
50.0000 mg | ORAL_TABLET | Freq: Every day | ORAL | Status: DC
  Filled 2014-04-26: qty 1

## 2014-04-26 NOTE — Progress Notes (Signed)
CSW pursued placement at the following facilities:  Pending:  [facility]  [facility]   Denied due to At Capacity: [facility] [address] [place], [place] Disposition Social Worker [phone]

## 2014-04-26 NOTE — ED Notes (Signed)
Up to the bathroom 

## 2014-04-26 NOTE — ED Notes (Signed)
Up on the phone 
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
## 2014-04-26 NOTE — ED Notes (Signed)
Pt presents with SI plan to overdose on Trazodone, pt reports she has been having these thoughts for past week. Pt admits to financial stressors and Health Insurance being cancelled. Denies HI or AV hallucinations.  Pt reports she attempted SI by cutting wrist x 3 years ago.  Denies street drugs or alcohol abuse.  Will continue to monitor for safety, pt calm & cooperative at present, no distress noted.

## 2014-04-26 NOTE — ED Provider Notes (Signed)
CSN: [id number]     Arrival date & time 04/26/14  0120 History   First MD Initiated Contact with Patient 04/26/14 0249     Chief Complaint  Patient presents with  . Medical Clearance     (Consider location/radiation/quality/duration/timing/severity/associated sxs/prior Treatment) Patient is a 44 y.o. female presenting with mental health disorder. The history is provided by the patient. No language interpreter was used.  Mental Health Problem Presenting symptoms: depression and suicidal thoughts   Associated symptoms comment:  She is having recurrent suicidal ideations, with plan to overdose. She denies any drug ingestion or overdose prior to arrival.    Past Medical History  Diagnosis Date  . Migraine headache   . Bipolar affective disorder, manic, mild   . Anxiety   . PTSD (post-traumatic stress disorder)   . Arthritis     soriatic   Past Surgical History  Procedure Laterality Date  . Cholecystectomy    . Cesarean section     Family History  Problem Relation Age of Onset  . Depression Father   . Alcohol abuse Sister   . Depression Sister   . Alcohol abuse Maternal Grandfather    History  Substance Use Topics  . Smoking status: Never Smoker   . Smokeless tobacco: Never Used  . Alcohol Use: No   OB History    No data available     Review of Systems  Constitutional: Negative for fever and chills.  HENT: Negative.   Respiratory: Negative.   Cardiovascular: Negative.   Gastrointestinal: Negative.   Musculoskeletal: Negative.   Skin: Negative.   Neurological: Negative.   Psychiatric/Behavioral: Positive for suicidal ideas and dysphoric mood.      Allergies  Review of patient's allergies indicates no known allergies.  Home Medications   Prior to Admission medications   Medication Sig Start Date End Date Taking? Authorizing Provider  DULoxetine (CYMBALTA) 30 MG capsule Take 1 capsule (30 mg total) by mouth daily. For depression 02/14/14   Velna Hatchet May  Agustin, NP  etanercept (ENBREL) 50 MG/ML injection Inject 0.98 mLs (50 mg total) into the skin every Sunday. 02/14/14   Lindwood Qua, NP  lithium carbonate 300 MG capsule Take 1 capsule (300 mg total) by mouth every morning. Mood Stabilization 02/14/14   Lindwood Qua, NP  lithium carbonate 600 MG capsule Take 1 capsule (600 mg total) by mouth at bedtime. For mood stabilization 02/14/14   [facility], NP  nitrofurantoin, macrocrystal-monohydrate, (MACROBID) 100 MG capsule Take 1 capsule (100 mg total) by mouth every 12 (twelve) hours. FOR URINARY TRACT INFECTION 02/14/14   Lindwood Qua, NP  norgestimate-ethinyl estradiol (ORTHO-CYCLEN,SPRINTEC,PREVIFEM) 0.25-35 MG-MCG tablet Take 1 tablet by mouth at bedtime. 02/14/14   Velna Hatchet May Agustin, NP  SUMAtriptan (IMITREX) 100 MG tablet Take 1 tablet (100 mg total) by mouth as needed for migraine. May repeat in 2 hours if headache persists or recurs. 02/14/14   Velna Hatchet May Agustin, NP  SUMAtriptan (IMITREX) 100 MG tablet Take 1 tablet (100 mg total) by mouth every 2 (two) hours as needed for migraine or headache. 02/14/14   Velna Hatchet May Agustin, NP  topiramate (TOPAMAX) 50 MG tablet Take 1 tablet (50 mg total) by mouth at bedtime. 02/14/14   Lindwood Qua, NP  traZODone (DESYREL) 50 MG tablet Take 1 tablet (50 mg total) by mouth at bedtime. fOR INSOMNIA 02/14/14   Velna Hatchet May Agustin, NP  ziprasidone (GEODON) 40 MG capsule Take 1 capsule (40 mg total) by mouth every  morning. FOR MOOD STABILIZATION 02/14/14   Velna Hatchet May Agustin, NP   BP 113/51 mmHg  Pulse 71  Temp(Src) 97.8 F (36.6 C) (Oral)  Resp 15  SpO2 100%  LMP 04/12/2014 (Approximate) Physical Exam  Constitutional: She is oriented to person, place, and time. She appears well-developed and well-nourished.  HENT:  Head: Normocephalic.  Neck: Normal range of motion. Neck supple.  Cardiovascular: Normal rate and regular rhythm.   Pulmonary/Chest: Effort normal and breath  sounds normal.  Abdominal: Soft. Bowel sounds are normal. There is no tenderness. There is no rebound and no guarding.  Musculoskeletal: Normal range of motion.  Neurological: She is alert and oriented to person, place, and time.  Skin: Skin is warm and dry. No rash noted.  Psychiatric: Her speech is normal. She is slowed. She exhibits a depressed mood. She expresses suicidal ideation. She expresses suicidal plans.    ED Course  Procedures (including critical care time) Labs Review Labs Reviewed  CBC - Abnormal; Notable for the following:    WBC 11.5 (*)    All other components within normal limits  URINE RAPID DRUG SCREEN (HOSP PERFORMED) - Abnormal; Notable for the following:    Benzodiazepines POSITIVE (*)    All other components within normal limits  ACETAMINOPHEN LEVEL  COMPREHENSIVE METABOLIC PANEL  ETHANOL  SALICYLATE LEVEL    Imaging Review No results found.   EKG Interpretation None      MDM   Final diagnoses:  None    1. Suicidal ideation  TTS consult requested for disposition and treatment options. Patient stable for transfer to psych holding area.     Arnoldo Hooker, PA-C 04/26/14 0315  Hanley Seamen, MD 04/26/14 [phone]

## 2014-04-26 NOTE — ED Notes (Signed)
Pt's sister into see 

## 2014-04-26 NOTE — ED Notes (Addendum)
Pt reports that she was seen at [facility], but they do not have any available beds. Pt c/o SI with intent to OD on leftover Trazodone. Pt states that she has a previous attempt of self harm when she cut her wrists. Pt states that she has a hx of Bipolar and anxiety and has been having increased depression for the past week with a stressful situation financially. Pt calm and cooperative, noted to have psychomotor retardation during assessment. Pt denies HI, or AVH, or Etoh/ drug abuse.

## 2014-04-26 NOTE — ED Notes (Signed)
Crackers/cheese given

## 2014-04-26 NOTE — ED Notes (Signed)
Pt takes geodon at night
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
## 2014-04-26 NOTE — ED Notes (Signed)
Patient appears flat, cooperative. Denies SI, HI, AVH. Rates anxiety and feelings of depression at 9/10. States appetite has been poor and that she sleeps well with her medication.   Encouragement offered. Given Vistaril.   Q 15 safety checks continue.

## 2014-04-27 ENCOUNTER — Encounter (HOSPITAL_COMMUNITY): Payer: Self-pay | Admitting: Physician Assistant

## 2014-04-27 LAB — LITHIUM LEVEL: LITHIUM LVL: 0.47 mmol/L — AB (ref 0.80–1.40)

## 2014-04-27 MED ORDER — TRAZODONE HCL 50 MG PO TABS
50.0000 mg | ORAL_TABLET | Freq: Every day | ORAL | Status: DC
  Administered 2014-04-28: 50 mg via ORAL
  Filled 2014-04-27 (×5): qty 1
  Filled 2014-04-27: qty 14
  Filled 2014-04-27: qty 1

## 2014-04-27 MED ORDER — SUMATRIPTAN SUCCINATE 50 MG PO TABS
100.0000 mg | ORAL_TABLET | ORAL | Status: DC | PRN
  Administered 2014-04-29: 100 mg via ORAL
  Filled 2014-04-27: qty 2

## 2014-04-27 MED ORDER — LITHIUM CARBONATE 300 MG PO CAPS
300.0000 mg | ORAL_CAPSULE | Freq: Every morning | ORAL | Status: DC
  Administered 2014-04-27 – 2014-05-02 (×6): 300 mg via ORAL
  Filled 2014-04-27 (×7): qty 1
  Filled 2014-04-27: qty 42

## 2014-04-27 MED ORDER — ETANERCEPT 50 MG/ML ~~LOC~~ SOAJ
50.0000 mg | SUBCUTANEOUS | Status: DC
  Filled 2014-04-27: qty 0.98

## 2014-04-27 MED ORDER — TOPIRAMATE 25 MG PO TABS
50.0000 mg | ORAL_TABLET | Freq: Every day | ORAL | Status: DC
  Administered 2014-04-27 – 2014-05-01 (×5): 50 mg via ORAL
  Filled 2014-04-27: qty 28
  Filled 2014-04-27 (×6): qty 2

## 2014-04-27 MED ORDER — DULOXETINE HCL 30 MG PO CPEP
30.0000 mg | ORAL_CAPSULE | Freq: Every day | ORAL | Status: DC
  Administered 2014-04-27 – 2014-04-29 (×3): 30 mg via ORAL
  Filled 2014-04-27 (×4): qty 1

## 2014-04-27 MED ORDER — NORGESTIMATE-ETH ESTRADIOL 0.25-35 MG-MCG PO TABS: 1.0000 | ORAL_TABLET | Freq: Every day | ORAL | Status: DC

## 2014-04-27 MED ORDER — BUSPIRONE HCL 10 MG PO TABS
10.0000 mg | ORAL_TABLET | Freq: Two times a day (BID) | ORAL | Status: DC
  Administered 2014-04-27 – 2014-05-02 (×10): 10 mg via ORAL
  Filled 2014-04-27: qty 1
  Filled 2014-04-27: qty 28
  Filled 2014-04-27 (×3): qty 1
  Filled 2014-04-27: qty 28
  Filled 2014-04-27 (×8): qty 1

## 2014-04-27 MED ORDER — ZIPRASIDONE HCL 40 MG PO CAPS
40.0000 mg | ORAL_CAPSULE | Freq: Every day | ORAL | Status: DC
  Administered 2014-04-27 – 2014-05-01 (×5): 40 mg via ORAL
  Filled 2014-04-27 (×6): qty 1
  Filled 2014-04-27: qty 14
  Filled 2014-04-27: qty 2

## 2014-04-27 MED ORDER — ZIPRASIDONE HCL 40 MG PO CAPS
40.0000 mg | ORAL_CAPSULE | Freq: Every morning | ORAL | Status: DC
  Filled 2014-04-27 (×2): qty 1

## 2014-04-27 MED ORDER — LITHIUM CARBONATE 300 MG PO CAPS
600.0000 mg | ORAL_CAPSULE | Freq: Every day | ORAL | Status: DC
  Administered 2014-04-27 – 2014-05-01 (×5): 600 mg via ORAL
  Filled 2014-04-27 (×7): qty 2

## 2014-04-27 MED ORDER — ETANERCEPT 50 MG/ML ~~LOC~~ SOAJ: 50.0000 mg | SUBCUTANEOUS | Status: DC

## 2014-04-27 NOTE — Tx Team (Signed)
Initial Interdisciplinary Treatment Plan   PATIENT STRESSORS: Financial difficulties Health problems Loss of child Marital or family conflict Medication change or noncompliance   PATIENT STRENGTHS: Ability for insight Average or above average intelligence General fund of knowledge Motivation for treatment/growth Supportive family/friends   PROBLEM LIST: Problem List/Patient Goals Date to be addressed Date deferred Reason deferred Estimated date of resolution  Risk for Suicide      Depression       " I  need "health insurance"      " I need housing and know how I'm going to live"      "need help with getting disability"                               DISCHARGE CRITERIA:  Improved stabilization in mood, thinking, and/or behavior Motivation to continue treatment in a less acute level of care Verbal commitment to aftercare and medication compliance  PRELIMINARY DISCHARGE PLAN: Attend aftercare/continuing care group Attend PHP/IOP Placement in alternative living arrangements  PATIENT/FAMIILY INVOLVEMENT: This treatment plan has been presented to and reviewed with the patient, Katelyn [name].  The patient and family have been given the opportunity to ask questions and make suggestions.  Jacques Navy A 04/27/2014, 1:31 AM

## 2014-04-27 NOTE — Progress Notes (Addendum)
D Katelyn [name] remains acutely sad, depressed and disconnected. She looks down frequently and she speaks in an inaudible sounding voice.    A She attended this morning's group and she shared that she is here because she could not pay for her medication and she got very ill. She appears laden with guilt. She avoids making eye contact and  She looks down at her hands in her lap or at the floor. She is able to complete her morning assessment and on it she writes she is positive for SI within the past 24 hrs, but she contracts for safety with this nurse. She rates her depression and her hopelessness as " 9/910 ".     R Safety is in place .

## 2014-04-27 NOTE — Progress Notes (Signed)
D:  Pt passive SI- contracts for safety. Pt denies HI/AVH. Pt is pleasant and cooperative. Pt stated she felt better today, even though pt has depressed, flat affect, pt on unit interacting.   A: Pt was offered support and encouragement. Pt was given scheduled medications. Pt was encourage to attend groups. Q 15 minute checks were done for safety.   R:Pt attends groups and interacts well with peers and staff. Pt is taking medication. Pt has no complaints at this time .Pt receptive to treatment and safety maintained on unit.

## 2014-04-27 NOTE — Progress Notes (Signed)
Patient did attend the evening speaker AA meeting.  
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
## 2014-04-27 NOTE — Progress Notes (Signed)
Psychoeducational Group Note  Date:  04/27/2014 Time:  1015  Group Topic/Focus:  Making Healthy Choices:   The focus of this group is to help patients identify negative/unhealthy choices they were using prior to admission and identify positive/healthier coping strategies to replace them upon discharge.  Participation Level:  Active  Participation Quality:  Appropriate  Affect:  Appropriate  Cognitive:  Oriented  Insight:  Improving  Engagement in Group:  Engaged  Additional Comments:  Pt participated and added much to the group.  Clifford Benninger A 04/27/2014 

## 2014-04-27 NOTE — [facility] Suicide Risk Assessment (Signed)
Suicide Risk Assessment  Admission Assessment     Nursing information obtained from:    Demographic factors:    Current Mental Status:    Loss Factors:    Historical Factors:    Risk Reduction Factors:    Total Time spent with patient: 30 minutes  CLINICAL FACTORS:   Severe Anxiety and/or Agitation Bipolar Disorder:   Mixed State Depression:   Anhedonia Hopelessness Impulsivity Insomnia Severe  Psychiatric Specialty Exam:     Blood pressure 120/54, pulse 83, temperature 98.2 F (36.8 C), temperature source Oral, resp. rate 18, height  (1.575 m), weight 91.627 kg (202 lb), last menstrual period 04/12/2014.Body mass index is 36.94 kg/(m^2).  General Appearance: Casual  Eye Contact::  Good  Speech:  Clear and Coherent and Slow  Volume:  Decreased  Mood:  Depressed  Affect:  Constricted  Thought Process:  Goal Directed  Orientation:  Full (Time, Place, and Person)  Thought Content:  Rumination  Suicidal Thoughts:  Yes.  without intent/plan  Homicidal Thoughts:  No  Memory:  Immediate;   Good Recent;   Fair Remote;   Fair  Judgement:  Fair  Insight:  Shallow  Psychomotor Activity:  Decreased  Concentration:  Fair  Recall:  Fair  Fund of Knowledge:Good  Language: Good  Akathisia:  No  Handed:  Right  AIMS (if indicated):     Assets:  Communication Skills Desire for Improvement Physical Health  Sleep:  Number of Hours: 6.75   Musculoskeletal: Strength & Muscle Tone: within normal limits Gait & Station: normal Patient leans: N/A  COGNITIVE FEATURES THAT CONTRIBUTE TO RISK:  Closed-mindedness Polarized thinking    SUICIDE RISK:   Mild:  Suicidal ideation of limited frequency, intensity, duration, and specificity.  There are no identifiable plans, no associated intent, mild dysphoria and related symptoms, good self-control (both objective and subjective assessment), few other risk factors, and identifiable protective factors, including available and  accessible social support.  PLAN OF CARE:1. Admit for crisis management and stabilization. 2. Medication management to reduce current symptoms to base line and improve the     patient's overall level of functioning 3. Treat health problems as indicated. 4. Develop treatment plan to decrease risk of relapse upon discharge and the need for     readmission. 5. Psycho-social education regarding relapse prevention and self care. 6. Health care follow up as needed for medical problems. 7. Restart home medications where appropriate.  I certify that inpatient services furnished can reasonably be expected to improve the patient's condition.  Thedore Mins, MD 04/27/2014, 11:49 AM

## 2014-04-27 NOTE — [facility] Group Notes (Signed)
[facility] LCSW Group Therapy  04/27/2014 1:15 PM  Type of Therapy:  Group Therapy  Participation Level:  Minimal  Participation Quality:  Attentive and Sharing when asked direct questions  Affect:  Flat  Cognitive:  Oriented  Insight:  Developing  Engagement in Therapy:  Limited  Modes of Intervention:  Discussion, Exploration, Orientation, Socialization and Support  Summary of Progress/Problems: The main focus of today's process group was to identify the patient's current support system and decide on other supports that can be put in place. An emphasis was placed on using counselor, doctor, therapy groups, 12-step groups, and problem-specific support groups to expand supports. There was also an extensive discussion about what constitutes a healthy support versus an unhealthy support. The patient comprehension of the concepts presented, and identified her tendency to be a perfectionist  And tendency to isolate before being called out to met with medical staff.   Catherine C Harrill, LCSW 

## 2014-04-27 NOTE — Progress Notes (Signed)
Admission Note:  D:43 yr female who presents [facility] in no acute distress for the treatment of SI and Depression. Pt appears flat and depressed. Pt was calm and cooperative with admission process. Pt presents with passive SI and contracts for safety upon admission. Pt denies AVH . Pt has been denied disability which stresses her out. Pt took out her retirement savings in May and have been spending it until it is almost depleted. Pt ended up losing Health Insurance, Pt was concerned because the past week pt has been decompensating. Pt has been forgetting things, even forgetting to feed her son. Pt took son to stay with his dad, "I need help with trying to get Medicaid, I have no money now. I need help with where I'm going to live, and how I'm going to live"  A:Skin was assessed and found to have Psoriasis patches all over body(L-buttock, L-back, R/L-knees, R-abdomen, L-breast, Scalp) . POC and unit policies explained and understanding verbalized. Consents obtained. Food and fluids offered, and fluids accepted.  R: Pt had no additional questions or concerns.

## 2014-04-27 NOTE — Progress Notes (Signed)
The patient did not attend group last evening since she had yet to be admitted to the unit.

## 2014-04-27 NOTE — Plan of Care (Signed)
Problem: Ineffective individual coping Goal: LTG: Patient will report a decrease in negative feelings Outcome: Progressing Pt stated she was improving with her feelings, "I felt a little better today" Goal: STG:Pt. will utilize relaxation techniques to reduce stress STG: Patient will utilize relaxation techniques to reduce stress levels  Outcome: Progressing Pt utilized deep breathing when anxiety increased earlier in the evening  Problem: Diagnosis: Increased Risk For Suicide Attempt Goal: STG-Patient Will Report Suicidal Feelings to Staff Outcome: Progressing Pt passive SI, but contracts for safety at this time , stating she will inform staff if pt has feelings to harm self
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
## 2014-04-28 DIAGNOSIS — F3130 Bipolar disorder, current episode depressed, mild or moderate severity, unspecified: Secondary | ICD-10-CM

## 2014-04-28 DIAGNOSIS — F411 Generalized anxiety disorder: Secondary | ICD-10-CM | POA: Diagnosis present

## 2014-04-28 DIAGNOSIS — R45851 Suicidal ideations: Secondary | ICD-10-CM

## 2014-04-28 NOTE — [facility] Group Notes (Addendum)
   [facility] LCSW Aftercare Discharge Planning Group Note  04/28/2014  8:45 AM   Participation Quality: Alert, Appropriate and Oriented  Mood/Affect: Depressed and Flat  Depression Rating: 9  Anxiety Rating: 10  Thoughts of Suicide: Pt denies SI/HI  Will you contract for safety? Yes  Current AVH: Pt denies  Plan for Discharge/Comments: Pt attended discharge planning group and actively participated in group. CSW provided pt with today's workbook. Patient reports high levels of depression and anxiety today. Patient reports being hospitalized due to SI and identified her stressors as housing and financial issues, as well as lack of health insurance. Discharge plans unknown at this time. CSW will continue to assess for appropriate referrals.  Transportation Means: Continuing to assess  Supports: No supports mentioned at this time  Samuella Bruin, MSW, [employer] Clinical Social Worker [employer] [phone]

## 2014-04-28 NOTE — [facility] Suicide Risk Assessment (Signed)
[facility] INPATIENT:  Family/Significant Other Suicide Prevention Education  Suicide Prevention Education:  Education Completed; sister Reggie Pile [phone],  (name of family member/significant other) has been identified by the patient as the family member/significant other with whom the patient will be residing, and identified as the person(s) who will aid the patient in the event of a mental health crisis (suicidal ideations/suicide attempt).  With written consent from the patient, the family member/significant other has been provided the following suicide prevention education, prior to the and/or following the discharge of the patient.  The suicide prevention education provided includes the following:  Suicide risk factors  Suicide prevention and interventions  National Suicide Hotline telephone number  [facility] assessment telephone number  [facility] Emergency Assistance 911  [facility] and/or Residential Mobile Crisis Unit telephone number  Request made of family/significant other to:  Remove weapons (e.g., guns, rifles, knives), all items previously/currently identified as safety concern.    Remove drugs/medications (over-the-counter, prescriptions, illicit drugs), all items previously/currently identified as a safety concern.  The family member/significant other verbalizes understanding of the suicide prevention education information provided.  The family member/significant other agrees to remove the items of safety concern listed above.  Katelyn [name], [place] 04/28/2014, 3:41 PM

## 2014-04-28 NOTE — Plan of Care (Signed)
Problem: Consults Goal: Depression Patient Education See Patient Education Module for education specifics.  Outcome: Completed/Met Date Met:  04/28/14 Nurse discussed depression with patient.

## 2014-04-28 NOTE — Progress Notes (Signed)
Adult Psychoeducational Group Note  Date:  04/28/2014 Time:  1015  Group Topic/Focus:  Self Care:   The focus of this group is to help patients understand the importance of self-care in order to improve or restore emotional, physical, spiritual, interpersonal, and financial health.  Participation Level:  Active  Participation Quality:  Appropriate and Attentive  Affect:  Appropriate  Cognitive:  Alert and Appropriate  Insight: Appropriate  Engagement in Group:  Engaged  Modes of Intervention:  Discussion, Education   Additional Comments:  Pt. attended and participated in group discussion focused on self-care geared towards strength and weakness and barriers  Gwenevere Ghazi Patience 04/28/2014, 1:49 PM

## 2014-04-28 NOTE — Progress Notes (Signed)
[facility] MD Progress Note  04/28/2014 4:08 PM Katelyn [name]  MRN:  [id number] Subjective:  Katelyn [name] states that she has been increasingly more overwhelmed. States that she is trying to get her disability approved. She had cashed out her retirement account in order to make it financially. She has no money left and she does not have insurance starting this month so she states she will not be able to afford her medications. States her depression is getting worst, she is forgetting things, not paying bills, not taking care of herself, not eating not taking showers. States that her sister made her aware that she was not able to take care of herself that she could not take care of her son. She gave custody of her son to his father. States that given that she knows the father is going to take care of him she has gotten increasingly more suicidal. States that she has not been this close to killing herself ever. Does not see a way out.  Diagnosis:   DSM5: Trauma-Stressor Disorders:  Posttraumatic Stress Disorder (309.81) Depressive Disorders:  Major Depressive Disorder - Severe (296.23) Total Time spent with patient: 30 minutes  Axis I: Bipolar, Depressed and Generalized Anxiety Disorder  ADL's:  Intact  Sleep: Poor  Appetite:  Poor Psychiatric Specialty Exam: Physical Exam  Review of Systems  Constitutional: Positive for malaise/fatigue.  HENT: Negative.   Eyes: Negative.   Respiratory: Negative.   Cardiovascular: Negative.   Gastrointestinal: Negative.   Genitourinary: Negative.   Musculoskeletal: Negative.   Skin: Negative.   Neurological: Positive for tremors and weakness.  Endo/Heme/Allergies: Negative.   Psychiatric/Behavioral: Positive for depression. The patient is nervous/anxious and has insomnia.     Blood pressure 115/58, pulse 87, temperature 97.5 F (36.4 C), temperature source Oral, resp. rate 16, height 5\' 2"  (1.575 m), weight 91.627 kg (202 lb), last menstrual period  04/12/2014.Body mass index is 36.94 kg/(m^2).  General Appearance: Fairly Groomed  Patent attorney::  Fair  Speech:  Clear and Coherent, Slow and not spontaneous  Volume:  Decreased  Mood:  Depressed  Affect:  Depressed  Thought Process:  Coherent and Goal Directed  Orientation:  Full (Time, Place, and Person)  Thought Content:  symptoms events worries concerns  Suicidal Thoughts:  Yes.  without intent/plan  Homicidal Thoughts:  No  Memory:  Immediate;   Fair Recent;   Fair Remote;   Fair  Judgement:  Fair  Insight:  Present  Psychomotor Activity:  Restlessness  Concentration:  Poor  Recall:  [employer] of Knowledge:Poor  Language: Poor  Akathisia:  No  Handed:    AIMS (if indicated):     Assets:  Desire for Improvement  Sleep:  Number of Hours: 6   Musculoskeletal: Strength & Muscle Tone: within normal limits Gait & Station: normal Patient leans: N/A  Current Medications: Current Facility-Administered Medications  Medication Dose Route Frequency Provider Last Rate Last Dose  . acetaminophen (TYLENOL) tablet 650 mg  650 mg Oral Q6H PRN Craige Cotta, MD      . alum & mag hydroxide-simeth (MAALOX/MYLANTA) 200-200-20 MG/5ML suspension 30 mL  30 mL Oral Q4H PRN Craige Cotta, MD      . busPIRone (BUSPAR) tablet 10 mg  10 mg Oral BID Mojeed Akintayo   10 mg at 04/28/14 0802  . DULoxetine (CYMBALTA) DR capsule 30 mg  30 mg Oral Daily Mojeed Akintayo   30 mg at 04/28/14 0801  . [START ON 05/04/2014] etanercept (ENBREL) 50 MG/ML  injection 50 mg  50 mg Subcutaneous Weekly Verne Spurr, PA-C      . hydrOXYzine (ATARAX/VISTARIL) tablet 25 mg  25 mg Oral Q6H PRN Craige Cotta, MD   25 mg at 04/28/14 1536  . lithium carbonate capsule 300 mg  300 mg Oral q morning - 10a Mojeed Akintayo   300 mg at 04/28/14 0949  . lithium carbonate capsule 600 mg  600 mg Oral QHS Mojeed Akintayo   600 mg at 04/27/14 2128  . magnesium hydroxide (MILK OF MAGNESIA) suspension 30 mL  30 mL Oral Daily  PRN Rockey Situ Cobos, MD      . SUMAtriptan (IMITREX) tablet 100 mg  100 mg Oral Q2H PRN Mojeed Akintayo      . topiramate (TOPAMAX) tablet 50 mg  50 mg Oral QHS Mojeed Akintayo   50 mg at 04/27/14 2127  . traZODone (DESYREL) tablet 50 mg  50 mg Oral QHS Mojeed Akintayo   50 mg at 04/27/14 2200  . ziprasidone (GEODON) capsule 40 mg  40 mg Oral QPC supper Verne Spurr, PA-C   40 mg at 04/27/14 1610    Lab Results:  Results for orders placed or performed during the hospital encounter of 04/26/14 (from the past 48 hour(s))  Lithium level     Status: Abnormal   Collection Time: 04/27/14  7:29 PM  Result Value Ref Range   Lithium Lvl 0.47 (L) 0.80 - 1.40 mmol/L    Comment: Performed at [facility]    Physical Findings: AIMS: Facial and Oral Movements Muscles of Facial Expression: None, normal Lips and Perioral Area: None, normal Jaw: None, normal Tongue: None, normal,Extremity Movements Upper (arms, wrists, hands, fingers): None, normal Lower (legs, knees, ankles, toes): None, normal, Trunk Movements Neck, shoulders, hips: None, normal, Overall Severity Severity of abnormal movements (highest score from questions above): None, normal Incapacitation due to abnormal movements: None, normal Patient's awareness of abnormal movements (rate only patient's report): No Awareness, Dental Status Current problems with teeth and/or dentures?: Yes Does patient usually wear dentures?: No  CIWA:  CIWA-Ar Total: 1 COWS:  COWS Total Score: 2  Treatment Plan Summary: Daily contact with patient to assess and evaluate symptoms and progress in treatment Medication management  Plan: Supportive approach/coping skills           Depression: will optimize response to the Cymbalta. Will increase the dose to 60 mg daily           Anxiety: will pursue the Buspar but short term will add Klonopin. She was using xanax sporadically.  It was D/C and she was placed on Buspar. The Buspar is going  to take some time to work. Will start Klonopin to offer some relief from the acute anxiety as she admits it is incapacitating           Will monitor for any mood instability coming from the increase in the antidepressant            Will work with CBT;mindfulness to help with both the depression and the anxiety           Will work to [employer] hope and see options for herself other than suicide Medical Decision Making Problem Points:  Review of psycho-social stressors (1) Data Points:  Review or order medicine tests (1) Review of medication regiment & side effects (2) Review of new medications or change in dosage (2)  I certify that inpatient services furnished can reasonably be expected to improve the  patient's condition.   Purnell Daigle A 04/28/2014, 4:08 PM

## 2014-04-28 NOTE — [facility] Group Notes (Signed)
[facility] LCSW Group Therapy 04/28/2014  1:15 pm  Type of Therapy: Group Therapy Participation Level: Active  Participation Quality: Attentive, Sharing and Supportive  Affect: Depressed and Flat  Cognitive: Alert and Oriented  Insight: Developing/Improving and Engaged  Engagement in Therapy: Developing/Improving and Engaged  Modes of Intervention: Clarification, Confrontation, Discussion, Education, Exploration,  Limit-setting, Orientation, Problem-solving, Rapport Building, Dance movement psychotherapist, Socialization and Support  Summary of Progress/Problems: Pt identified obstacles faced currently and processed barriers involved in overcoming these obstacles. Pt identified steps necessary for overcoming these obstacles and explored motivation (internal and external) for facing these difficulties head on. Pt further identified one area of concern in their lives and chose a goal to focus on for today. Patient identified her obstacle as guilt regarding relinquishing custody of her son and self-doubt, as well as her "financial burden". Patient reports that she needs to start by setting small, realistic goals for herself and giving herself credit and encouragement after achieving these goals. CSW's and other group members provided emotional support and encouragement to patient.  Katelyn [name], MSW, [employer] Clinical Social Worker [facility] [phone]

## 2014-04-28 NOTE — Progress Notes (Addendum)
D:  Patient's self inventory sheet, patient has poor sleep, no sleep medication given.  Fair appetite, low energy level, poor concentration.  Rated depression, hopeless and anxiety 9.  Denied withdrawals.  Denied SI.  Denied physical problems.  Denied pain.  Goal is to find a way to get meds temporarily; got a plan for health insurance; develop a plan for living arrangements.  Plans to meet with SW.  No discharge plan.  No problems anticipated after discharge. A:  Medications administered per MD orders.  Emotional support and encouragement. R:  Denied SI and HI, contracts for safety.  Denied A/V hallucinations.  Safety maintained with 15 minute checks. Patient was given written information about her medications today.

## 2014-04-29 MED ORDER — CLONAZEPAM 0.5 MG PO TBDP: 0.5000 mg | ORAL_TABLET | Freq: Two times a day (BID) | ORAL | Status: DC | PRN

## 2014-04-29 MED ORDER — BUPROPION HCL ER (SR) 100 MG PO TB12
100.0000 mg | ORAL_TABLET | Freq: Every day | ORAL | Status: DC
  Administered 2014-04-29: 100 mg via ORAL
  Filled 2014-04-29 (×3): qty 1

## 2014-04-29 MED ORDER — CLONAZEPAM 0.5 MG PO TBDP: 1.0000 mg | ORAL_TABLET | Freq: Two times a day (BID) | ORAL | Status: DC | PRN

## 2014-04-29 MED ORDER — BUPROPION HCL ER (XL) 150 MG PO TB24
150.0000 mg | ORAL_TABLET | Freq: Every day | ORAL | Status: DC
  Administered 2014-04-30 – 2014-05-02 (×3): 150 mg via ORAL
  Filled 2014-04-29 (×4): qty 1
  Filled 2014-04-29: qty 14
  Filled 2014-04-29 (×2): qty 1

## 2014-04-29 NOTE — Progress Notes (Addendum)
D:  Patient's self inventory sheet, patient slept good last night, sleep medication was helpful.  Good appetite, low energy level, good concentration.  Rated depression, hopeless, anxiety 8.  Denied withdrawals.  Denied SI.  Denied physical problems.  Denied pain.  Goal is to build self esteem.  Plans self affirmations.  Does have discharge plans.  No problems anticipated after discharge. A:  Medications administered per MD orders.  Emotional support and encouragement. R:  Denied SI and HI.  Denied A/V hallucinations.  Safety maintained with 15 minute checks.
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
## 2014-04-29 NOTE — [facility] Group Notes (Signed)
[facility] LCSW Group Therapy  04/29/2014   1:15 PM   Type of Therapy:  Group Therapy  Participation Level:  Active  Participation Quality:  Attentive, Sharing and Supportive  Affect:  Depressed and Flat  Cognitive:  Alert and Oriented  Insight:  Developing/Improving and Engaged  Engagement in Therapy:  Developing/Improving and Engaged  Modes of Intervention:  Clarification, Confrontation, Discussion, Education, Exploration, Limit-setting, Orientation, Problem-solving, Rapport Building, Dance movement psychotherapisteality Testing, Socialization and Support  Summary of Progress/Problems: The topic for group therapy was feelings about diagnosis.  Pt actively participated in group discussion on their past and current diagnosis and how they feel towards this.  Pt also identified how society and family members judge them, based on their diagnosis as well as stereotypes and stigmas.  Patient discussed feeling anger towards herself, her mental illness, and the world. Patient reports that she has been hospitalized 5 times within the past year due to her Bipolar illness. Patient reports "everything has collapsed around me". CSW's and other group members provided support and discussed healthy ways to use her anger as a motivation for recovery.  Katelyn BruinKristin Tyshawn [name], MSW, [employer] Clinical Social Worker [facility] [phone]

## 2014-04-29 NOTE — Tx Team (Signed)
Interdisciplinary Treatment Plan Update (Adult) Date: 04/29/2014   Time Reviewed: 9:30 AM  Progress in Treatment: Attending groups: Yes Participating in groups: Yes Taking medication as prescribed: Yes Tolerating medication: Yes Family/Significant other contact made: Yes, CSW has spoken with patient's sister [place] Patient understands diagnosis: Yes Discussing patient identified problems/goals with staff: Yes Medical problems stabilized or resolved: Yes Denies suicidal/homicidal ideation: Treatment team continuing to assess Issues/concerns per patient self-inventory: Yes Other:  New problem(s) identified: N/A  Discharge Plan or Barriers: Patient   Reason for Continuation of Hospitalization:  Depression Anxiety Medication Stabilization   Comments: N/A  Estimated length of stay: 3-5 days  For review of initial/current patient goals, please see plan of care. Patient is a 44 year old Caucasian female with a diagnosis of Bipolar disorder MRE depressed with SI, and plan with access and means. [place] in [place]. Stressors include giving up custody of her son, and financial and housing difficulties. Patient will benefit from crisis stabilization, medication evaluation, group therapy, and psycho education in addition to case management for discharge planning. Patient and CSW reviewed pt's identified goals and treatment plan. Pt verbalized understanding and agreed to treatment plan.   Attendees: Patient:     Family:     Physician:  Dr. Dub MikesLugo; Dr. Jama Flavorsobos 04/29/2014 9:30 AM   Nursing:  Quintella ReichertBeverly Knight; Michaelle BirksBritney Guthrie, RN 04/29/2014 9:30 AM   Clinical Social Worker:  Samuella BruinKristin Priscille Shadduck, LCSWA  04/29/2014 9:30 AM    Other: Juline PatchQuylle Hodnett, LCSW 04/29/2014 9:30 AM    Other:  Leisa LenzValerie Enoch, [facility] 04/29/2014 9:30 AM    Other:  Tomasita Morrowelora Sutton, P4CC 04/29/2014 9:30 AM   Other:  Onnie BoerJennifer Clark, Case Manager 04/29/2014 9:30 AM   Other:    Other:      Scribe for Treatment Team:  Samuella BruinKristin  Shota Kohrs, MSW, LCSWA [phone]

## 2014-04-29 NOTE — Progress Notes (Signed)
Recreation Therapy Notes  Animal-Assisted Activity/Therapy (AAA/T) Program Checklist/Progress Notes Patient Eligibility Criteria Checklist & Daily Group note for Rec Tx Intervention  Date: 01.05.2015 Time: 2:45pm [address]    AAA/T Program Assumption of Risk Form signed by Patient/ or Parent Legal Guardian yes  Patient is free of allergies or sever asthma yes  Patient reports no fear of animals yes  Patient reports no history of cruelty to animals yes  Patient understands his/her participation is voluntary yes  Patient washes hands before animal contact yes  Patient washes hands after animal contact yes  Behavioral Response: Engaged, Appropriate   Education: Charity fundraiserHand Washing, Appropriate Animal Interaction   Education Outcome: Acknowledges education.   Clinical Observations/Feedback: Patient interacted appropriately with therapy dog and peers. Patient asked appropriate questions about therapy dog and his training.   Marykay Lexenise L Krislynn Gronau, LRT/CTRS  Roshawnda Pecora L 04/29/2014 4:53 PM

## 2014-04-29 NOTE — Progress Notes (Signed)
Pt attended spiritual care group on grief and loss facilitated by chaplain Burnis KingfisherMatthew Stalnaker and counseling intern [place] Sylvanus Telford. Group opened with brief discussion and psycho-social ed around grief and loss in relationships and in relation to self - identifying life patterns, circumstances, changes that cause losses. Established group norm of speaking from own life experience. Group goal of establishing open and affirming space for members to share loss and experience with grief, normalize grief experience and provide psycho social education and grief support.  Group drew on narrative and Alderian therapeutic modalities.   Katelyn [name] was present throughout group but did not contribute. [name] approached chaplain after group and requested to meet individually later.  [place] Shaliah Wann Counseling Intern

## 2014-04-29 NOTE — Progress Notes (Signed)
[facility] MD Progress Note  04/29/2014 4:05 PM Katelyn [name]  MRN:  [id number] Subjective:  Charlcie states she is concerned about the depression. She feels the mania is under better control wit the Geodon and the Lithium but not the depression. She is currently using Cymbalta 30 mg. Attempts to increase the Cymbalta resulted in "severe, malignant mania" states that after it was decreased back to 30 mg the Mania got better but she continues to experience the severe depression. She has had trials with Zoloft, Effexor, Paxil, Prozac and does remember having had some good results with Wellbutrin. ( at that time believed to have unipolar depression)  Diagnosis:   DSM5: Trauma-Stressor Disorders:  Posttraumatic Stress Disorder (309.81) Depressive Disorders:  Major Depressive Disorder - Severe (296.23) Total Time spent with patient: 30 minutes  Axis I: Bipolar, Depressed and Generalized Anxiety Disorder  ADL's:  Intact  Sleep: Fair  Appetite:  Poor  Psychiatric Specialty Exam: Physical Exam  Review of Systems  Constitutional: Positive for malaise/fatigue.  HENT: Negative.   Eyes: Negative.   Respiratory: Negative.   Cardiovascular: Negative.   Gastrointestinal: Negative.   Genitourinary: Negative.   Musculoskeletal: Negative.   Skin: Negative.   Neurological: Positive for weakness.  Endo/Heme/Allergies: Negative.   Psychiatric/Behavioral: Positive for depression and suicidal ideas. The patient is nervous/anxious.     Blood pressure 120/58, pulse 85, temperature 98.2 F (36.8 C), temperature source Oral, resp. rate 18, height  (1.575 m), weight 91.627 kg (202 lb), last menstrual period 04/12/2014.Body mass index is 36.94 kg/(m^2).  General Appearance: Fairly Groomed  Patent attorney::  Fair  Speech:  Clear and Coherent and not spontaneous  Volume:  Decreased  Mood:  Depressed  Affect:  Restricted  Thought Process:  Coherent and Goal Directed  Orientation:  Full (Time, Place, and  Person)  Thought Content:  symptoms events worries concerns  Suicidal Thoughts:  No  Homicidal Thoughts:  No  Memory:  Immediate;   Fair Recent;   Fair Remote;   Fair  Judgement:  Fair  Insight:  Present  Psychomotor Activity:  Decreased  Concentration:  Fair  Recall:  [employer] of Knowledge:Fair  Language: Fair  Akathisia:  No  Handed:  Right  AIMS (if indicated):     Assets:  Desire for Improvement Social Support  Sleep:  Number of Hours: 6.75   Musculoskeletal: Strength & Muscle Tone: within normal limits Gait & Station: normal Patient leans: N/A  Current Medications: Current Facility-Administered Medications  Medication Dose Route Frequency Provider Last Rate Last Dose  . acetaminophen (TYLENOL) tablet 650 mg  650 mg Oral Q6H PRN Craige Cotta, MD      . alum & mag hydroxide-simeth (MAALOX/MYLANTA) 200-200-20 MG/5ML suspension 30 mL  30 mL Oral Q4H PRN Craige Cotta, MD      . buPROPion [facility] SR) 12 hr tablet 100 mg  100 mg Oral Daily Rachael Fee, MD   100 mg at 04/29/14 1307  . busPIRone (BUSPAR) tablet 10 mg  10 mg Oral BID Mojeed Akintayo   10 mg at 04/29/14 0832  . clonazePAM (KLONOPIN) disintegrating tablet 0.5 mg  0.5 mg Oral BID PRN Rachael Fee, MD      . Melene Muller ON 05/04/2014] etanercept (ENBREL) 50 MG/ML injection 50 mg  50 mg Subcutaneous Weekly Verne Spurr, PA-C      . hydrOXYzine (ATARAX/VISTARIL) tablet 25 mg  25 mg Oral Q6H PRN Craige Cotta, MD   25 mg at 04/28/14 1536  .  lithium carbonate capsule 300 mg  300 mg Oral q morning - 10a Mojeed Akintayo   300 mg at 04/29/14 1108  . lithium carbonate capsule 600 mg  600 mg Oral QHS Mojeed Akintayo   600 mg at 04/28/14 2120  . magnesium hydroxide (MILK OF MAGNESIA) suspension 30 mL  30 mL Oral Daily PRN Craige CottaFernando A Cobos, MD      . SUMAtriptan (IMITREX) tablet 100 mg  100 mg Oral Q2H PRN Mojeed Akintayo   100 mg at 04/29/14 1109  . topiramate (TOPAMAX) tablet 50 mg  50 mg Oral QHS Mojeed Akintayo    50 mg at 04/28/14 2120  . traZODone (DESYREL) tablet 50 mg  50 mg Oral QHS Mojeed Akintayo   50 mg at 04/28/14 2120  . ziprasidone (GEODON) capsule 40 mg  40 mg Oral QPC supper Verne SpurrNeil Mashburn, PA-C   40 mg at 04/28/14 [id number]    Lab Results:  Results for orders placed or performed during the hospital encounter of 04/26/14 (from the past 48 hour(s))  Lithium level     Status: Abnormal   Collection Time: 04/27/14  7:29 PM  Result Value Ref Range   Lithium Lvl 0.47 (L) 0.80 - 1.40 mmol/L    Comment[facility]    Physical Findings: AIMS: Facial and Oral Movements Muscles of Facial Expression: None, normal Lips and Perioral Area: None, normal Jaw: None, normal Tongue: None, normal,Extremity Movements Upper (arms, wrists, hands, fingers): None, normal Lower (legs, knees, ankles, toes): None, normal, Trunk Movements Neck, shoulders, hips: None, normal, Overall Severity Severity of abnormal movements (highest score from questions above): None, normal Incapacitation due to abnormal movements: None, normal Patient's awareness of abnormal movements (rate only patient's report): No Awareness, Dental Status Current problems with teeth and/or dentures?: Yes Does patient usually wear dentures?: No  CIWA:  CIWA-Ar Total: 6 COWS:  COWS Total Score: 2  Treatment Plan Summary: Daily contact with patient to assess and evaluate symptoms and progress in treatment Medication management  Plan: Supportive approach/coping skills           Bipolar Depression: will try the Wellbutrin what she seems to remember being effective            without causing mania. Admits it gave her energy but not "manic energy." she is willing              to be very mindful of any mood instability created by using Wellbutrin at this time           Cymbalta does not seem to be helping at this sub therapeutic dosage and increasing                 the dose has triggered mania in the past for what  we are going to D/C it           Anxiety: We are going to pursue the Klonopin on a PRN basis as well as CBT/mindfulness Medical Decision Making Problem Points:  Established problem, worsening (2) and Review of psycho-social stressors (1) Data Points:  Review of medication regiment & side effects (2) Review of new medications or change in dosage (2)  I certify that inpatient services furnished can reasonably be expected to improve the patient's condition.   Sajan Cheatwood A 04/29/2014, 4:05 PM

## 2014-04-29 NOTE — Clinical Social Work Note (Signed)
CSW spoke with patient about various outpatient options including CST and peer support- patient is considering but is agreeable to following up with [facility].  CSW provided patient with information on [facility] supportive living program as well as emergency assistance programs.  Samuella BruinKristin Ginevra Tacker, MSW, [employer] Clinical Social Worker [facility] [phone]

## 2014-04-29 NOTE — Plan of Care (Signed)
Problem: Consults Goal: Anxiety Disorder Patient Education See Patient Education Module for eduction specifics.  Outcome: Completed/Met Date Met:  04/29/14 Nurse discussed anxiety with patient.        

## 2014-04-29 NOTE — Progress Notes (Addendum)
Patient ID: [name], female   DOB: 09/10/1970, 44 y.o.   MRN: [id number] D: client visible on the unit, in dayroom watching TV, interacting appropriately with peers. Client reports "feel a little better than yesterday" "up moving around, interacting with people" "two major issues, SW help me with one of them and I got to deal with the other own my own" Client says she feels she has gained the coping skills needed to deal with the issue. A: Writer introduced self to client, provided emotional support, reviewed medications, administered as ordered. Staff will monitor q7615min for safety. R: Client is safe on the unit.

## 2014-04-29 NOTE — Clinical Social Work Note (Signed)
At patient's request, CSW cancelled her upcoming outpatient appointments scheduled for this [facility] and Jeanella FlatterySarah Young at [facility].  Samuella BruinKristin Keon Pender, MSW, [employer] Clinical Social Worker [facility] [phone]

## 2014-04-29 NOTE — [facility] Group Notes (Signed)
The focus of this group is to educate the patient on the purpose and policies of crisis stabilization and provide a format to answer questions about their admission.  The group details unit policies and expectations of patients while admitted.  Patient attended 0900 nurse education orientation group this morning.  Patient actively participated, appropriate affect, alert, appropriate insight and engagement.  Today patient will work on 3 goals for discharge.  

## 2014-04-29 NOTE — Progress Notes (Signed)
The focus of this group is to help patients review their daily goal of treatment and discuss progress on daily workbooks. Pt attended the evening group session and responded to all discussion prompts from the Writer. Pt shared that today was a great day on the unit, the highlight of which was learning from the social worker of a resource that could help her with medications upon discharge. Jordanna shared that this had been a great stresser recently. Pt reported having no additional requests from Nursing Staff this evening. Pt's affect was appropriate.

## 2014-04-30 MED ORDER — NON FORMULARY: Freq: Every day | Status: DC

## 2014-04-30 MED ORDER — NORGESTIMATE-ETH ESTRADIOL 0.25-35 MG-MCG PO TABS
1.0000 | ORAL_TABLET | Freq: Every day | ORAL | Status: DC
  Administered 2014-04-29: 1 via ORAL

## 2014-04-30 NOTE — Progress Notes (Signed)
Pt reports she had a good day.  She states she went to the groups and feels she has gained information that will help her.  She did not discuss the situation with her son during this time.  Pt denies SI/HI/AV at this time.  Pt informed writer that she did not want to take the scheduled trazodone because it made her too sleepy during the day.  Pt is otherwise med compliant.  Pt makes her needs known to staff.  Support and encouragement offered.  Safety maintained with q15 minute checks.

## 2014-04-30 NOTE — [facility] Group Notes (Signed)
[facility] LCSW Group Therapy  Emotional Regulation 1:15 - 2: 30 PM        04/30/2014     Type of Therapy:  Group Therapy  Participation Level:  Appropriate  Participation Quality:  Appropriate  Affect:  Depressed, Flat  Cognitive:  Attentive Appropriate  Insight:  Developing/Improving Engaged  Engagement in Therapy:  Developing/Improving Engaged  Modes of Intervention:  Discussion Exploration Problem-Solving Supportive  Summary of Progress/Problems:  Group topic was emotional regulations.  Patient participated in the discussion and was able to identify an emotion that needed to regulated. She advised of of being very anxious due to having to find a permanent place to live before the end of the month.  Patient shared she is considering returning to her parents' home.     Wynn BankerHodnett, Suede Greenawalt Hairston 04/30/2014

## 2014-04-30 NOTE — Progress Notes (Signed)
[facility]  04/30/2014 1:03 PM Jake ChurchChrista Fincher  MRN:  [id number] Subjective:  Katelyn [name] is trying to be hopeful. Admits that he works hard at it but he slips back to the hoplessness helplessness mind set. Was able to speak to her son who is doing well. Knowing that he is doing well with his father brings a sense of relief in that he is well taken care of but at the same time will fuel the thoughts that she is not needed. Still not sure how she is going to handle all the stressors she is facing out of here, the financial issues, the lack of insurance, having to let go of her place at the end of this month. Admits to feeling increasingly more overwhelmed. Has tolerated the Wellbutrin well so far. Diagnosis:   DSM5: Trauma-Stressor Disorders:  Posttraumatic Stress Disorder (309.81) Depressive Disorders:  Major Depressive Disorder - Severe (296.23) Total Time spent with patient: 30 minutes  Axis I: Bipolar, Depressed and Generalized Anxiety Disorder  ADL's:  Intact  Sleep: Fair  Appetite:  Fair  Psychiatric Specialty Exam: Physical Exam  Review of Systems  Constitutional: Positive for malaise/fatigue.  HENT: Negative.   Eyes: Negative.   Respiratory: Negative.   Cardiovascular: Negative.   Gastrointestinal: Negative.   Genitourinary: Negative.   Musculoskeletal: Negative.   Skin: Negative.   Neurological: Negative.   Endo/Heme/Allergies: Negative.   Psychiatric/Behavioral: Positive for depression and suicidal ideas. The patient is nervous/anxious.     Blood pressure 122/52, pulse 83, temperature 97.5 F (36.4 C), temperature source Oral, resp. rate 16, height 5\' 2"  (1.575 m), weight 91.627 kg (202 lb), last menstrual period 04/12/2014.Body mass index is 36.94 kg/(m^2).  General Appearance: Fairly Groomed  Patent attorneyye Contact::  Fair  Speech:  Clear and Coherent  Volume:  Decreased  Mood:  Anxious, Depressed and worried  Affect:  Depressed and anxious worried  Thought Process:   Coherent and Goal Directed  Orientation:  Full (Time, Place, and Person)  Thought Content:  symptoms events worries concerns  Suicidal Thoughts:  Intermittent  Homicidal Thoughts:  No  Memory:  Immediate;   Fair Recent;   Fair Remote;   Fair  Judgement:  Fair  Insight:  Present  Psychomotor Activity:  Restlessness  Concentration:  Fair  Recall:  FiservFair  Fund of Knowledge:Fair  Language: Fair  Akathisia:  No  Handed:  Right  AIMS (if indicated):     Assets:  Desire for Improvement Social Support  Sleep:  Number of Hours: 6.5   Musculoskeletal: Strength & Muscle Tone: within normal limits Gait & Station: normal Patient leans: N/A  Current Medications: Current Facility-Administered Medications  Medication Dose Route Frequency Provider Last Rate Last Dose  . acetaminophen (TYLENOL) tablet 650 mg  650 mg Oral Q6H PRN Craige CottaFernando A Cobos, MD      . alum & mag hydroxide-simeth (MAALOX/MYLANTA) 200-200-20 MG/5ML suspension 30 mL  30 mL Oral Q4H PRN Rockey SituFernando A Cobos, MD      . buPROPion (WELLBUTRIN XL) 24 hr tablet 150 mg  150 mg Oral Daily Rachael FeeIrving A Feliberto Stockley, MD   150 mg at 04/30/14 0747  . busPIRone (BUSPAR) tablet 10 mg  10 mg Oral BID Mojeed Akintayo   10 mg at 04/30/14 0748  . clonazePAM (KLONOPIN) disintegrating tablet 0.5 mg  0.5 mg Oral BID PRN Rachael FeeIrving A Laci Frenkel, MD      . Melene Muller[START ON 05/04/2014] etanercept (ENBREL) 50 MG/ML injection 50 mg  50 mg Subcutaneous Weekly Verne SpurrNeil Mashburn,  PA-C      . hydrOXYzine (ATARAX/VISTARIL) tablet 25 mg  25 mg Oral Q6H PRN Craige Cotta, MD   25 mg at 04/28/14 1536  . lithium carbonate capsule 300 mg  300 mg Oral q morning - 10a Mojeed Akintayo   300 mg at 04/30/14 1007  . lithium carbonate capsule 600 mg  600 mg Oral QHS Mojeed Akintayo   600 mg at 04/29/14 2119  . magnesium hydroxide (MILK OF MAGNESIA) suspension 30 mL  30 mL Oral Daily PRN Craige Cotta, MD      . norgestimate-ethinyl estradiol (ORTHO-CYCLEN,SPRINTEC,PREVIFEM) 0.25-35 MG-MCG tablet 1  tablet  1 tablet Oral QHS Craige Cotta, MD   1 tablet at 04/29/14 2200  . SUMAtriptan (IMITREX) tablet 100 mg  100 mg Oral Q2H PRN Mojeed Akintayo   100 mg at 04/29/14 1109  . topiramate (TOPAMAX) tablet 50 mg  50 mg Oral QHS Mojeed Akintayo   50 mg at 04/29/14 2119  . traZODone (DESYREL) tablet 50 mg  50 mg Oral QHS Mojeed Akintayo   50 mg at 04/28/14 2120  . ziprasidone (GEODON) capsule 40 mg  40 mg Oral QPC supper Verne Spurr, PA-C   40 mg at 04/29/14 1803    Lab Results: No results found for this or any previous visit (from the past 48 hour(s)).  Physical Findings: AIMS: Facial and Oral Movements Muscles of Facial Expression: None, normal Lips and Perioral Area: None, normal Jaw: None, normal Tongue: None, normal,Extremity Movements Upper (arms, wrists, hands, fingers): None, normal Lower (legs, knees, ankles, toes): None, normal, Trunk Movements Neck, shoulders, hips: None, normal, Overall Severity Severity of abnormal movements (highest score from questions above): None, normal Incapacitation due to abnormal movements: None, normal Patient's awareness of abnormal movements (rate only patient's report): No Awareness, Dental Status Current problems with teeth and/or dentures?: No Does patient usually wear dentures?: No  CIWA:  CIWA-Ar Total: 1 COWS:  COWS Total Score: 2  Treatment Plan Summary: Daily contact with patient to assess and evaluate symptoms and progress in treatment Medication management  Plan: Supportive approach/coping skills           Bipolar Depression: Continue to work with the Wellbutrin, Wellbutrin XL 150 was started this AM. Will assess for tolerability as well as evidence of mood instability coming from using an antidepressant having Bipolar Disorder            Will monitor for any possible evidence of withdrawal coming from D/C the Cymbalta            Will continue to monitor the anxiety and optimize response to the Buspar            Will decide on  the use of Klonopin PRN ( based on effectiveness)            Continue to work with CBT;mindfulness to help with the anxiety and the depression             Instal hope/develop a plan of how to address the environmental factors contributing to worsening of the depression               Medical Decision Making Problem Points:  Review of psycho-social stressors (1) Data Points:  Review of medication regiment & side effects (2) Review of new medications or change in dosage (2)  I certify that inpatient services furnished can reasonably be expected to improve the patient's condition.   Iliyah Bui A 04/30/2014, 1:03 PM

## 2014-04-30 NOTE — Plan of Care (Signed)
Problem: Consults Goal: Suicide Risk Patient Education (See Patient Education module for education specifics)  Outcome: Completed/Met Date Met:  04/30/14 Nurse discuss suicide thoughts with patient.        

## 2014-04-30 NOTE — Progress Notes (Signed)
D:  Patient's self inventory sheet, patient slept fair last night, no sleep medication given.  Fair appetite, good concentration.  Rated hopeless and anxiety #8.  Denied withdrawals.  Denied SI.  Denied physical problems.  Denied pain.  Goal is to make plans for living arrangements.  Plans to chart pro/con; make phone call.  Does have discharge plans.  No problems anticipated after discharge. A:  Medications administered per MD orders. Emotional support and encouragement given patient. R:  Denied SI and HI, contracts for safety.  Denied A/V hallucinations.  Safety maintained with 15 minute  Checks.

## 2014-04-30 NOTE — [facility] Group Notes (Signed)
Adult Psychoeducational Group Note  Date:  04/30/2014 Time:  9:49 PM  Group Topic/Focus:  NA Meeting  Participation Level:  Minimal  Participation Quality:  Attentive  Affect:  Flat  Cognitive:  Alert  Insight: None  Engagement in Group:  None  Modes of Intervention:  Discussion and Education  Additional Comments:  Emlyn attended group.  Caroll RancherLindsay, Keyleigh Manninen A 04/30/2014, 9:49 PM

## 2014-04-30 NOTE — Progress Notes (Signed)
Patient in day room at the beginning of this shift interacting with peers. Mood and affects flat and depressed. Reported having an ok day, denied SI/Hi and denied hallucinations. Also said that her medication working. Q 15 minute check continues as ordered to maintain safety.

## 2014-04-30 NOTE — [facility] Group Notes (Signed)
[facility]   04/30/2014 1:14 PM    Participation Quality:  Appropraite  Mood/Affect:  Appropriate  Depression Rating:  7  Anxiety Rating:  7  Thoughts of Suicide:  No  Will you contract for safety?   NA  Current AVH:  No  Plan for Discharge/Comments:  Patient attended discharge planning group and actively participated in group. Patient reports feeling a little better.  She will follow up with [facility] for counseling. Suicide prevention education reviewed and SPE document provided.   Transportation Means: Patient has transportation.   Supports:  Patient has a support system.   Raigan Baria, Joesph JulyQuylle Hairston

## 2014-05-01 MED ORDER — GABAPENTIN 100 MG PO CAPS: 100.0000 mg | ORAL_CAPSULE | Freq: Three times a day (TID) | ORAL | Status: DC

## 2014-05-01 MED ORDER — GABAPENTIN 100 MG PO CAPS
100.0000 mg | ORAL_CAPSULE | Freq: Three times a day (TID) | ORAL | Status: DC
  Administered 2014-05-01 (×2): 100 mg via ORAL
  Filled 2014-05-01: qty 1
  Filled 2014-05-01: qty 42
  Filled 2014-05-01 (×4): qty 1
  Filled 2014-05-01 (×2): qty 42
  Filled 2014-05-01 (×2): qty 1

## 2014-05-01 NOTE — Progress Notes (Signed)
D: Patient is alert and oriented. Pt's mood and affect is anxious, depressed, sad and blunted. Pt's eye contact is fair. Pt reports her depression 9/10 and hopelessness 8/10. Pt reports increased anxiety this morning with some "shakiness" and is requesting additional medication coverage but does not want klonipin. Pt did not attend morning groups d/t being tried and states "I did not sleep well last night," pt did attend afternoon groups. Pt denies SI/HI and AVH. Pt observed to be tearful after long phone call; pt reports her son's father informed her that she no longer has custody of her son, pt reports she still has visitation rights and is happy that she will still be able to see him. Pt's BP has been low at times today. A: New orders placed, and acknowledged. Encouragement/Support provided to pt. Active listening by RN. Pt encouraged to increase fluids, fluids given, will reassess BP frequently. Scheduled medications administered per providers orders (See MAR). 15 minute checks continued per protocol for patient safety.  R: Patient cooperative and receptive to nursing interventions.

## 2014-05-01 NOTE — Progress Notes (Signed)
Recreation Therapy Notes  Animal-Assisted Activity/Therapy (AAA/T) Program Checklist/Progress Notes Patient Eligibility Criteria Checklist & Daily Group note for Rec Tx Intervention  Date: 01.07.2015 Time: 2:45pm [address]    AAA/T Program Assumption of Risk Form signed by Patient/ or Parent Legal Guardian yes  Patient is free of allergies or sever asthma yes  Patient reports no fear of animals yes  Patient reports no history of cruelty to animals yes  Patient understands his/her participation is voluntary yes  Patient washes hands before animal contact yes  Patient washes hands after animal contact yes  Behavioral Response: Engaged, Appropriate   Education: Charity fundraiserHand Washing, Appropriate Animal Interaction   Education Outcome: Acknowledges education.   Clinical Observations/Feedback: Patient pet therapy dog appropriately, asked appropriate questions about therapy dog and his training and fed therapy dog a treat in exchange for completing basic obedience command.    Marykay Lexenise L Mani Celestin, LRT/CTRS  Jearl KlinefelterBlanchfield, Armanie Ullmer L 05/01/2014 4:27 PM

## 2014-05-01 NOTE — Progress Notes (Signed)
Pt did not attend the AA speaker meeting. Pt was invited, however stated in her room and was asleep.  Caswell Corwinwen, Chastin Garlitz C, NT 12:11 PM

## 2014-05-01 NOTE — Plan of Care (Signed)
Problem: Ineffective individual coping Goal: STG: Patient will remain free from self harm Outcome: Progressing Patient remains free from self harm. 15 minute checks continued per protocol for patient safety.  Goal: STG:Pt. will utilize relaxation techniques to reduce stress STG: Patient will utilize relaxation techniques to reduce stress levels  Outcome: Progressing Patient encouraged to try progressive relaxation techniques to relieve stress and anxiety levels. RN demonstrated with pt. Pt reported to RN later that she did try it while resting in bed and reports that it helped her some.  Problem: Diagnosis: Increased Risk For Suicide Attempt Goal: STG-Patient Will Attend All Groups On The Unit Outcome: Progressing Patient did not attend morning groups d/t being tired and not sleeping well last night, however she has attended all afternoon groups. Goal: STG-Patient Will Comply With Medication Regime Outcome: Progressing Patient has complied with medication regimen today with ease.  Comments:  Alena BillsGuthrie, Janathan Bribiesca A, RN

## 2014-05-01 NOTE — Progress Notes (Signed)
[facility] MD Progress Note  05/01/2014 3:10 PM Katelyn [name]  MRN:  [id number] Subjective:  Afnan admits to increased anxiety since he heard her mother is not willing to allow her to stay at her house. States she got very upset, she got the news at 9 PM and could hardly sleep. She admits to a lot of anxiety. States she did not want to use the Klonopin as she had been using Xanax 1 mg every day for the last 3 years and it had quit working for her for what she wants off and not to have another benzodiazepine take its place. A fried of hers came to visit yesterday and offered to share her story at her church as well as with other mutual friends to see if someone will be willing to help her temporarily until she gets herself together.  Diagnosis:   DSM5: Trauma-Stressor Disorders:  Posttraumatic Stress Disorder (309.81) Total Time spent with patient: 30 minutes  Axis I: Bipolar, Depressed and Generalized Anxiety Disorder  ADL's:  Intact  Sleep: Poor  Appetite:  Poor  Psychiatric Specialty Exam: Physical Exam  Review of Systems  Constitutional: Negative.   HENT: Negative.   Eyes: Negative.   Respiratory: Negative.   Cardiovascular: Negative.   Gastrointestinal: Negative.   Genitourinary: Negative.   Musculoskeletal: Negative.   Skin: Negative.   Neurological: Negative.   Endo/Heme/Allergies: Negative.   Psychiatric/Behavioral: Positive for depression. The patient is nervous/anxious and has insomnia.     Blood pressure 120/53, pulse 82, temperature 98.4 F (36.9 C), temperature source Oral, resp. rate 16, height  (1.575 m), weight 91.627 kg (202 lb), last menstrual period 04/12/2014.Body mass index is 36.94 kg/(m^2).  General Appearance: Fairly Groomed  Patent attorney::  Fair  Speech:  Clear and Coherent and not spontaneous  Volume:  Decreased  Mood:  Anxious and Depressed  Affect:  Depressed and Tearful  Thought Process:  Coherent and Goal Directed  Orientation:  Full (Time,  Place, and Person)  Thought Content:  most recent events, symptoms worries concerns  Suicidal Thoughts:  No  Homicidal Thoughts:  No  Memory:  Immediate;   Fair Recent;   Fair Remote;   Fair  Judgement:  Fair  Insight:  Present  Psychomotor Activity:  Restlessness  Concentration:  Fair  Recall:  [employer] of Knowledge:Fair  Language: Fair  Akathisia:  No  Handed:  Right  AIMS (if indicated):     Assets:  Desire for Improvement  Sleep:  Number of Hours: 5.75   Musculoskeletal: Strength & Muscle Tone: within normal limits Gait & Station: normal Patient leans: N/A  Current Medications: Current Facility-Administered Medications  Medication Dose Route Frequency Provider Last Rate Last Dose  . acetaminophen (TYLENOL) tablet 650 mg  650 mg Oral Q6H PRN Craige Cotta, MD      . alum & mag hydroxide-simeth (MAALOX/MYLANTA) 200-200-20 MG/5ML suspension 30 mL  30 mL Oral Q4H PRN Rockey Situ Cobos, MD      . buPROPion (WELLBUTRIN XL) 24 hr tablet 150 mg  150 mg Oral Daily Rachael Fee, MD   150 mg at 05/01/14 0847  . busPIRone (BUSPAR) tablet 10 mg  10 mg Oral BID Mojeed Akintayo   10 mg at 05/01/14 0847  . [START ON 05/04/2014] etanercept (ENBREL) 50 MG/ML injection 50 mg  50 mg Subcutaneous Weekly Verne Spurr, PA-C      . gabapentin (NEURONTIN) capsule 100 mg  100 mg Oral TID Rachael Fee, MD  100 mg at 05/01/14 1137  . hydrOXYzine (ATARAX/VISTARIL) tablet 25 mg  25 mg Oral Q6H PRN Craige CottaFernando A Cobos, MD   25 mg at 05/01/14 0008  . lithium carbonate capsule 300 mg  300 mg Oral q morning - 10a Mojeed Akintayo   300 mg at 05/01/14 1051  . lithium carbonate capsule 600 mg  600 mg Oral QHS Mojeed Akintayo   600 mg at 04/30/14 2115  . magnesium hydroxide (MILK OF MAGNESIA) suspension 30 mL  30 mL Oral Daily PRN Craige CottaFernando A Cobos, MD      . norgestimate-ethinyl estradiol (ORTHO-CYCLEN,SPRINTEC,PREVIFEM) 0.25-35 MG-MCG tablet 1 tablet  1 tablet Oral QHS Craige CottaFernando A Cobos, MD   1 tablet at  04/29/14 2200  . SUMAtriptan (IMITREX) tablet 100 mg  100 mg Oral Q2H PRN Mojeed Akintayo   100 mg at 04/29/14 1109  . topiramate (TOPAMAX) tablet 50 mg  50 mg Oral QHS Mojeed Akintayo   50 mg at 04/30/14 2115  . traZODone (DESYREL) tablet 50 mg  50 mg Oral QHS Mojeed Akintayo   50 mg at 04/28/14 2120  . ziprasidone (GEODON) capsule 40 mg  40 mg Oral QPC supper Verne SpurrNeil Mashburn, PA-C   40 mg at 04/30/14 1836    Lab Results: No results found for this or any previous visit (from the past 48 hour(s)).  Physical Findings: AIMS: Facial and Oral Movements Muscles of Facial Expression: None, normal Lips and Perioral Area: None, normal Jaw: None, normal Tongue: None, normal,Extremity Movements Upper (arms, wrists, hands, fingers): None, normal Lower (legs, knees, ankles, toes): None, normal, Trunk Movements Neck, shoulders, hips: None, normal, Overall Severity Severity of abnormal movements (highest score from questions above): None, normal Incapacitation due to abnormal movements: None, normal Patient's awareness of abnormal movements (rate only patient's report): No Awareness, Dental Status Current problems with teeth and/or dentures?: No Does patient usually wear dentures?: No  CIWA:  CIWA-Ar Total: 1 COWS:  COWS Total Score: 2  Treatment Plan Summary: Daily contact with patient to assess and evaluate symptoms and progress in treatment Medication management  Plan: Supportive approach/coping skills           Bipolar Depression: pursue the Wellbutrin further ( does not seem to be inducing mood instability), will get Lithium level           Anxiety: will use Neurontin as a way to help ease off the benzodiazepine and see if it can help the anxiety (off label)           Help explore other placement options           CBT;mindfulness   Medical Decision Making Problem Points:  Established problem, worsening (2) and Review of psycho-social stressors (1) Data Points:  Review or order clinical lab  tests (1) Review of medication regiment & side effects (2) Review of new medications or change in dosage (2)  I certify that inpatient services furnished can reasonably be expected to improve the patient's condition.   Mylissa Lambe A 05/01/2014, 3:10 PM

## 2014-05-01 NOTE — Progress Notes (Signed)
Patient ID: Jake ChurchChrista Haverland, female   DOB: 09/23/1970, 44 y.o.   MRN: [id number] D: Client is visible on the unit, watching TV in dayroom, reports "depression at "6" of 10, notes day has been "difficult but I walked through it, did deep breathing, muscle relaxation" Client's goal today was "stay in the present moment, not let my mind race" Client felt her goal was meet as she utilized her coping skills to help relax. A: Writer commended client on using her coping skills encouraged her to continue to recognize symptoms of depression and incorporate coping skills needed. Staff will monitor q3815min for safety. R: client is safe on the unit, attended karaoke.

## 2014-05-01 NOTE — [facility] Group Notes (Signed)
[facility] LCSW Group Therapy  [facility] 1:15 - 2:30 PM  05/01/2014 2:04 PM   Type of Therapy:  Group Therapy  Participation Level: Active  Participation Quality:  Attentive  Affect:  Appropriate  Cognitive:  Appropriate  Insight:  Developing/Improving   Engagement in Therapy:  Developing/Improving   Modes of Intervention:  Discussion, Education, Exploration, Problem-Solving, Rapport Building, Support   Summary of Progress/Problems:   Patient was attentive to speaker from the [facility] as he shared his story of dealing with mental health/substance abuse issues and overcoming it by working a recovery program.  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  Patient stated she was glad to see the presenter and hear his tory.   Wynn BankerHodnett, Katelyn [name] 05/01/2014 2:04 PM

## 2014-05-01 NOTE — Progress Notes (Signed)
[facility] Group Notes:  (Nursing/MHT/Case Management/Adjunct)  Date:  05/01/2014  Time:  0915  Type of Therapy:  Psychoeducational Skills  Participation Level:  Did Not Attend  Participation Quality:  n/a  Affect: n/a  Cognitive: n/a  Insight: n/a  Engagement in Group: n/a  Modes of Intervention:  Discussion, Education, Exploration and Support  Summary of Progress/Problems: Pt did not attend group. Pt in bed asleep.  Aurora Maskwyman, Aylyn Wenzler E 05/01/2014, 11:28 AM

## 2014-05-02 DIAGNOSIS — F4310 Post-traumatic stress disorder, unspecified: Secondary | ICD-10-CM

## 2014-05-02 DIAGNOSIS — F319 Bipolar disorder, unspecified: Principal | ICD-10-CM

## 2014-05-02 DIAGNOSIS — F314 Bipolar disorder, current episode depressed, severe, without psychotic features: Secondary | ICD-10-CM | POA: Insufficient documentation

## 2014-05-02 LAB — LITHIUM LEVEL: Lithium Lvl: 0.67 mmol/L — ABNORMAL LOW (ref 0.80–1.40)

## 2014-05-02 MED ORDER — TOPIRAMATE 50 MG PO TABS: 50.0000 mg | ORAL_TABLET | Freq: Every day | ORAL | Status: DC

## 2014-05-02 MED ORDER — NORGESTIMATE-ETH ESTRADIOL 0.25-35 MG-MCG PO TABS: 1.0000 | ORAL_TABLET | Freq: Every day | ORAL | Status: DC

## 2014-05-02 MED ORDER — BUPROPION HCL ER (XL) 150 MG PO TB24: 150.0000 mg | ORAL_TABLET | Freq: Every day | ORAL | Status: DC

## 2014-05-02 MED ORDER — BUSPIRONE HCL 10 MG PO TABS: 10.0000 mg | ORAL_TABLET | Freq: Two times a day (BID) | ORAL | Status: DC

## 2014-05-02 MED ORDER — ETANERCEPT 50 MG/ML ~~LOC~~ SOLN: 50.0000 mg | SUBCUTANEOUS | Status: DC

## 2014-05-02 MED ORDER — LITHIUM CARBONATE 300 MG PO CAPS: ORAL_CAPSULE | ORAL | Status: DC

## 2014-05-02 MED ORDER — TRAZODONE HCL 50 MG PO TABS: 50.0000 mg | ORAL_TABLET | Freq: Every day | ORAL | Status: DC

## 2014-05-02 MED ORDER — ZIPRASIDONE HCL 40 MG PO CAPS: 40.0000 mg | ORAL_CAPSULE | Freq: Every day | ORAL | Status: DC

## 2014-05-02 MED ORDER — HYDROXYZINE HCL 25 MG PO TABS: 25.0000 mg | ORAL_TABLET | Freq: Four times a day (QID) | ORAL | Status: DC | PRN

## 2014-05-02 MED ORDER — GABAPENTIN 100 MG PO CAPS: 100.0000 mg | ORAL_CAPSULE | Freq: Three times a day (TID) | ORAL | Status: DC

## 2014-05-02 MED ORDER — SUMATRIPTAN SUCCINATE 100 MG PO TABS: 100.0000 mg | ORAL_TABLET | ORAL | Status: DC | PRN

## 2014-05-02 NOTE — Discharge Summary (Signed)
Physician Discharge Summary Note  Patient:  Katelyn [name] is an 44 y.o., female MRN:  [id number] DOB:  December 07, 1970 Patient phone:  [phone] (home)  Patient address:   [address] [place] [address],  Total Time spent with patient: Greater than 30 minutes  Date of Admission:  04/26/2014  Date of Discharge: 05/02/14  Reason for Admission: Worsening symptoms of depression & suicidal ideations  Discharge Diagnoses: Principal Problem:   Bipolar affective disorder Active Problems:   Post traumatic stress disorder (PTSD)   Depressive episode   GAD (generalized anxiety disorder)  Psychiatric Specialty Exam: Physical Exam  Psychiatric: Her speech is normal and behavior is normal. Judgment and thought content normal. Her mood appears not anxious. Her affect is not angry, not blunt, not labile and not inappropriate. Cognition and memory are normal. She does not exhibit a depressed mood.    Review of Systems  Constitutional: Negative.   HENT: Negative.   Eyes: Negative.   Respiratory: Negative.   Cardiovascular: Negative.   Gastrointestinal: Negative.   Genitourinary: Negative.   Musculoskeletal: Negative.   Skin: Negative.   Neurological: Negative.   Endo/Heme/Allergies: Negative.   Psychiatric/Behavioral: Positive for depression (Stable). Negative for suicidal ideas, hallucinations, memory loss and substance abuse. The patient has insomnia (Stable). The patient is not nervous/anxious.     Blood pressure 111/45, pulse 88, temperature 98.4 F (36.9 C), temperature source Oral, resp. rate 16, height  (1.575 m), weight 91.627 kg (202 lb), last menstrual period 04/12/2014.Body mass index is 36.94 kg/(m^2).  See Md's suicidal RSA   Past Psychiatric History: Diagnosis: Bipolar disorder, GAD, PTSD  Hospitalizations: Patient notes "Multiple admissions" [facility] 2015, OV  Outpatient Care: Dr. Sallyanne Kuster  Substance Abuse Care: Not applicable  Self-Mutilation:  Denies  Suicidal Attempts: Cut wrists 2013  Violent Behaviors: None   Musculoskeletal: Strength & Muscle Tone: within normal limits Gait & Station: normal Patient leans: N/A  DSM5: Schizophrenia Disorders:  NA Obsessive-Compulsive Disorders:  NA Trauma-Stressor Disorders:  Posttraumatic Stress Disorder (309.81) Substance/Addictive Disorders:  NA Depressive Disorders:  Bipolar affective disorder, Generalized anxiety disorder  Axis Diagnosis:   AXIS I:   Bipolar affective disorder, Generalized anxiety disorder, PTSD AXIS II:  Deferred AXIS III:   Past Medical History  Diagnosis Date  . Migraine headache   . Bipolar affective disorder, manic, mild   . Anxiety   . PTSD (post-traumatic stress disorder)   . Arthritis     soriatic   AXIS IV:  other psychosocial or environmental problems and mental illness, chronic AXIS V:  63  Level of Care:  OP  Hospital Course: Patient reported a worsening depression over the previous week, when she walked into [facility] with a friend noting that she was unstable and having suicidal ideation with a plan to overdose on the Trazodone she has been hoarding at home. She was evaluated and reported worsening depression she rates as a 10/10 and notes it is the worst she has ever had. She states she is helpless, has decreased concentration, poor cognition to the point that she was not caring for her 54 yr old son. He is now with his father. She goes on to state that she has been isolating herself, has poor sleep, worsening irritability, with significant daily crying spells. She has panic attacks and rates her anxiety as a 10/10 as well.  While a patient on this unit, Kristilyn received medication regimen for mood stabilization. And her discharge plans included an appointment at an outpatient  clinic for psychiatric follow-up care and medication management. Venola was medicated and discharged on Buspar 10 twice daily for anxiety, Gabapentin 100 mg three times  daily for agitation, Hydroxyzine 25 mg Q 6 hours prn for anxiety/tension. Lithium Carbonate 300 mg Q daily and 600 mg Q bedtime for mood stabilization, Trazodone 50 mg Q bedtime for insomnia & Geodon 40 mg Q bedtime for mood control. She was also resumed on all her pertinent home medications for her other medical issues that she presented. She tolerated her treatment regimen without any significant adverse effects and or reactions reported. Inessa was enrolled and participated in the group counseling sessions being offered and held on this unit. She learned coping skills that should help her cope better to maintain mood stability.  Ariatna's mood responded well to her treatment regimen & her mood is stable. This is evidenced by her reports of improved mood and absence of suicidal ideations. She is currently being discharged to continue psychiatric treatment & medication management at the [facility] and [facility] services [facility]. Upon discharge, she adamantly denies any SIHI, AVH, delusional thoughts and or paranoia. She received from the [facility] pharmacy, a 14 days worth, supply samples of her [facility] discharge medications. She left [facility] with all personal belongings in no apparent distress. Transportation patient's arrangement.  Consults:  psychiatry  Significant Diagnostic Studies:  labs: CBC with diff, CMP, UDS, toxicology tests, U/A, results reviewed, no changes  Discharge Vitals:   Blood pressure 111/45, pulse 88, temperature 98.4 F (36.9 C), temperature source Oral, resp. rate 16, height 5\' 2"  (1.575 m), weight 91.627 kg (202 lb), last menstrual period 04/12/2014. Body mass index is 36.94 kg/(m^2). Lab Results:   Results for orders placed or performed during the hospital encounter of 04/26/14 (from the past 72 hour(s))  Lithium level     Status: Abnormal   Collection Time: 05/02/14  6:39 AM  Result Value Ref Range   Lithium Lvl 0.67 (L) 0.80 - 1.40  mmol/L    Comment: Performed at [facility]    Physical Findings: AIMS: Facial and Oral Movements Muscles of Facial Expression: None, normal Lips and Perioral Area: None, normal Jaw: None, normal Tongue: None, normal,Extremity Movements Upper (arms, wrists, hands, fingers): None, normal Lower (legs, knees, ankles, toes): None, normal, Trunk Movements Neck, shoulders, hips: None, normal, Overall Severity Severity of abnormal movements (highest score from questions above): None, normal Incapacitation due to abnormal movements: None, normal Patient's awareness of abnormal movements (rate only patient's report): No Awareness, Dental Status Current problems with teeth and/or dentures?: No Does patient usually wear dentures?: No  CIWA:  CIWA-Ar Total: 1 COWS:  COWS Total Score: 2  Psychiatric Specialty Exam: See Psychiatric Specialty Exam and Suicide Risk Assessment completed by Attending Physician prior to discharge.  Discharge destination:  Home  Is patient on multiple antipsychotic therapies at discharge:  No   Has Patient had three or more failed trials of antipsychotic monotherapy by history:  No  Recommended Plan for Multiple Antipsychotic Therapies: NA    Medication List    STOP taking these medications        DULoxetine 30 MG capsule  Commonly known as:  CYMBALTA     nitrofurantoin (macrocrystal-monohydrate) 100 MG capsule  Commonly known as:  MACROBID      TAKE these medications      Indication   buPROPion 150 MG 24 hr tablet  Commonly known as:  WELLBUTRIN XL  Take 1 tablet (150 mg total) by mouth daily. For depression   Indication:  Major Depressive Disorder     busPIRone 10 MG tablet  Commonly known as:  BUSPAR  Take 1 tablet (10 mg total) by mouth 2 (two) times daily. For anxiety   Indication:  Generalized Anxiety Disorder     etanercept 50 MG/ML injection  Commonly known as:  ENBREL  Inject 0.98 mLs (50 mg total) into the skin every  Sunday. 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 For agitation/pain managment   Indication:  Agitation, Pain     hydrOXYzine 25 MG tablet  Commonly known as:  ATARAX/VISTARIL  Take 1 tablet (25 mg total) by mouth every 6 (six) hours as needed for anxiety.   Indication:  Tension, Anxiety     lithium carbonate 300 MG capsule  Take 1 capsule (300 mg in a.m & 2 capsules (600 mg) at bedtime: For mood stabilization   Indication:  Mood Stabilization     norgestimate-ethinyl estradiol 0.25-35 MG-MCG tablet  Commonly known as:  ORTHO-CYCLEN,SPRINTEC,PREVIFEM  Take 1 tablet by mouth at bedtime. For Birth control method   Indication:  Birth control method     SUMAtriptan 100 MG tablet  Commonly known as:  IMITREX  Take 1 tablet (100 mg total) by mouth every 2 (two) hours as needed for migraine or headache.   Indication:  Headache, Migraine Headache     topiramate 50 MG tablet  Commonly known as:  TOPAMAX  Take 1 tablet (50 mg total) by mouth at bedtime. For migraine headache   Indication:  Migraine Headache     traZODone 50 MG tablet  Commonly known as:  DESYREL  Take 1 tablet (50 mg total) by mouth at bedtime. For sleep   Indication:  Trouble Sleeping     ziprasidone 40 MG capsule  Commonly known as:  GEODON  Take 1 capsule (40 mg total) by mouth daily after supper. For mood control   Indication:  Manic-Depression, FOR MOOD STABILIZATION           Follow-up Information    [facility]   Specialty:  Professional Counselor   Why:  Please present to walk-in clinic Monday-Friday between 8 am to 12 pm for assessment for medication management services.   Contact information:   [facility] [address] [place] [address] [phone]       [facility] On 05/06/2014.   Why:  Therapy appointment on Tuesday January 12th at 9 am. Please call office if you need to reschedule.   Contact information:   [facility] [address] [place] [place], [place] [address] [phone]       Follow-up recommendations:  Activity:  As tolerated Diet: As recommended by your primary care doctor. Keep all scheduled follow-up appointments as recommended.  Comments:  Take all your medications as prescribed by your mental healthcare provider. Report any adverse effects and or reactions from your medicines to your outpatient provider promptly. Patient is instructed and cautioned to not engage in alcohol and or illegal drug use while on prescription medicines. In the event of worsening symptoms, patient is instructed to call the crisis hotline, 911 and  or go to the nearest ED for appropriate evaluation and treatment of symptoms. Follow-up with your primary care provider for your other medical issues, concerns and or health care needs.   Total Discharge Time:  Greater than 30 minutes.  Signed: Sanjuana Kava, PMHNP-BC 05/02/2014, 10:53 AM  Patient seen, evaluated and I agree with notes by Nurse Practitioner. Thedore Mins, MD

## 2014-05-02 NOTE — Plan of Care (Signed)
Problem: Alteration in mood Goal: LTG-Patient reports reduction in suicidal thoughts (Patient reports reduction in suicidal thoughts and is able to verbalize a safety plan for whenever patient is feeling suicidal)  Outcome: Progressing Client denies suicidal ideations, will notify staff if feeling suicidal, contracts for safety.

## 2014-05-02 NOTE — Progress Notes (Signed)
Pt was discharged home today. She denied any S/I H/I or A/V hallucinations.  She was given f/u appointment, rx, sample medications, and hotline info booklet. She voiced understanding to all instructions provided.  She declined the need for smoking cessation materials.  

## 2014-05-02 NOTE — Progress Notes (Signed)
[facility] :  Will you be returning to the same living situation after discharge: Yes,  patient plans to return back to her apartment At discharge, do you have transportation home?:Yes,  patient reports access to transportation  Do you have the ability to pay for your medications:Yes,  patient will be provided with medication samples and prescriptions at discharge  Release of information consent forms completed and in the chart;  Patient's signature needed at discharge.  Patient to Follow up at: Follow-up Information    [facility]   Specialty:  Professional Counselor   Why:  Please present to walk-in clinic Monday-Friday between 8 am to 12 pm for assessment for medication management services.   Contact information:   [facility] [address] [place] [address] [phone]       [facility] with [facility] On 05/06/2014.   Why:  Therapy appointment on Tuesday January 12th at 9 am. Please call office if you need to reschedule.   Contact information:   [facility] [address] [place], [place] [address] [phone]        Patient denies SI/HI:   Yes,  denies    Safety Planning and Suicide Prevention discussed:  Yes,  with patient and sister  N/A patient is not a smoker  Beverley Sherrard, [place] CarboKristin L 05/02/2014, 10:33 AM

## 2014-05-02 NOTE — Progress Notes (Signed)
Patient did attend the evening karaoke group.  
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
## 2014-05-02 NOTE — [facility] Group Notes (Signed)
   [facility]  05/02/2014  8:45 AM   Participation Quality: Alert, Appropriate and Oriented  Mood/Affect: Depressed and Flat  Depression Rating: 4  Anxiety Rating: 6  Thoughts of Suicide: Pt denies SI/HI  Will you contract for safety? Yes  Current AVH: Pt denies  Plan for Discharge/Comments: Pt attended discharge planning group and actively participated in group. CSW provided pt with today's workbook. Patient reports feeling "better today" as a result of attending groups and hearing presentation by [facility] guest speaker yesterday. She reports feeling ready to discharge home despite having "issues with my legs as a side effect of a medication." Patient reports that she has addressed this side effect with her nursing staff. Patient plans to return home to follow up [facility] at [facility]. Patient has also been provided with information on local emergency assistance programs and [place] supportive housing program.   Transportation Means: Pt reports access to transportation  Supports: Patient identified her sister Lamar LaundrySonya as a support.  Samuella BruinKristin Teagen Mcleary, MSW, [employer] IncLCSWA Clinical Social Worker [facility] [phone]

## 2014-05-02 NOTE — Plan of Care (Signed)
Problem: Alteration in mood Goal: STG-Patient reports thoughts of self-harm to staff Outcome: Progressing Client rates depression as "6" of 10. Notes day been difficult but using coping skills "deep breathing, muscle relaxation"

## 2014-05-02 NOTE — Tx Team (Signed)
Interdisciplinary Treatment Plan Update (Adult) Date: 05/02/2014   Time Reviewed: 9:30 AM  Progress in Treatment: Attending groups: Yes Participating in groups: Yes Taking medication as prescribed: Yes Tolerating medication: Yes Family/Significant other contact made: Yes, CSW has spoken with patient's sister [place] Patient understands diagnosis: Yes Discussing patient identified problems/goals with staff: Yes Medical problems stabilized or resolved: Yes Denies suicidal/homicidal ideation: Yes Issues/concerns per patient self-inventory: Yes Other:  New problem(s) identified: N/A  Discharge Plan or Barriers:  1/8: Patient plans to return home to follow up with [facility] and [facility] for [facility] management services.  Reason for Continuation of Hospitalization:  Depression Anxiety Medication Stabilization   Comments: N/A  Estimated length of stay: Discharge anticipated for today 05/02/14.  For review of initial/current patient goals, please see plan of care. Patient is a 44 year old Caucasian female with a diagnosis of Bipolar disorder MRE depressed with SI, and plan with access and means. [place]. Stressors include giving up custody of her son, and financial and housing difficulties. Patient will benefit from crisis stabilization, medication evaluation, group therapy, and psycho education in addition to case management for discharge planning. Patient and CSW reviewed pt's identified goals and treatment plan. Pt verbalized understanding and agreed to treatment plan.   Attendees: Patient:    Family:    Physician: Dr. Jama Flavorsobos; Dr. Dub MikesLugo 05/02/2014 9:30 AM  Nursing: Harold Barbanonecia Byrd, Robbie LouisVivian Kent , RN 05/02/2014 9:30 AM  Clinical Social Worker: Samuella BruinKristin Arran Fessel, LCSWA 05/02/2014 9:30 AM  Other: Juline PatchQuylle Hodnett, LCSW 05/02/2014 9:30 AM  Other: Leisa LenzValerie Enoch, Vesta MixerMonarch Liaison 05/02/2014 9:30 AM   Other: Onnie BoerJennifer Clark, Case Manager 05/02/2014 9:30 AM  Other: Serena ColonelAggie Nwoko, NP 05/02/2014 9:30 AM  Other: Fransisca KaufmannLaura Davis, NP 05/02/2014 9:30 AM  Other: Tomasita Morrowelora Sutton, P4CC 05/02/2014 9:30 AM  Other:      Scribe for Treatment Team:  Samuella BruinKristin Tanis Burnley, MSW, [employer]LCSWA [phone]

## 2014-05-05 NOTE — Progress Notes (Signed)
Patient Discharge Instructions:  After Visit Summary (AVS):   Faxed to:  05/05/14 Discharge Summary Note:   Faxed to:  05/05/14 Psychiatric Admission Assessment Note:   Faxed to:  05/05/14 Suicide Risk Assessment - Discharge Assessment:   Faxed to:  05/05/14 Faxed/Sent to the Next Level Care provider:  05/05/14 Faxed to [facility] @ [phone] Faxed to [facility] @ [phone]  Jerelene ReddenSheena E [place], 05/05/2014, 3:28 PM

## 2014-10-06 ENCOUNTER — Encounter (HOSPITAL_COMMUNITY): Payer: Self-pay

## 2014-10-06 ENCOUNTER — Other Ambulatory Visit (HOSPITAL_COMMUNITY): Payer: BLUE CROSS/BLUE SHIELD | Admitting: Psychiatry

## 2014-10-06 DIAGNOSIS — F314 Bipolar disorder, current episode depressed, severe, without psychotic features: Secondary | ICD-10-CM | POA: Insufficient documentation

## 2014-10-06 DIAGNOSIS — F411 Generalized anxiety disorder: Secondary | ICD-10-CM | POA: Diagnosis not present

## 2014-10-06 DIAGNOSIS — G4700 Insomnia, unspecified: Secondary | ICD-10-CM | POA: Diagnosis not present

## 2014-10-06 DIAGNOSIS — F3130 Bipolar disorder, current episode depressed, mild or moderate severity, unspecified: Secondary | ICD-10-CM

## 2014-10-06 DIAGNOSIS — F4310 Post-traumatic stress disorder, unspecified: Secondary | ICD-10-CM | POA: Insufficient documentation
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
## 2014-10-07 ENCOUNTER — Other Ambulatory Visit (HOSPITAL_COMMUNITY): Payer: BLUE CROSS/BLUE SHIELD | Admitting: Psychiatry

## 2014-10-07 ENCOUNTER — Encounter (HOSPITAL_COMMUNITY): Payer: Self-pay | Admitting: Psychiatry

## 2014-10-07 DIAGNOSIS — F314 Bipolar disorder, current episode depressed, severe, without psychotic features: Secondary | ICD-10-CM | POA: Diagnosis not present

## 2014-10-07 DIAGNOSIS — F3130 Bipolar disorder, current episode depressed, mild or moderate severity, unspecified: Secondary | ICD-10-CM

## 2014-10-07 NOTE — Progress Notes (Signed)
Psychiatric Initial Adult Assessment   Patient Identification: Katelyn [name] MRN:  [id number] Date of Evaluation:  10/07/2014 Referral Source: self Chief Complaint:depressed   Visit Diagnosis: No diagnosis found. Diagnosis:   Patient Active Problem List   Diagnosis Date Noted  . Bipolar disorder, current episode depressed, severe, without psychotic features [F31.4]   . GAD (generalized anxiety disorder) [F41.1] 04/28/2014  . Depressive episode [F32.9] 04/26/2014  . Suicidal ideation [R45.851]   . Bipolar affective disorder [F31.9]   . Bipolar 1 disorder with moderate mania [F31.12] 02/11/2014  . Bipolar disorder, unspecified [F31.9] 06/15/2013  . Polypharmacy [Z79.899] 01/08/2013  . 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 Psoriasis [L40.9] 08/05/2011  . 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 Post traumatic stress disorder (PTSD) [F43.10] 04/10/2011  . Generalized anxiety disorder [F41.1] 04/10/2011   History of Present Illness:  Ms Amspacher has been treated for bipolar disorder tor many years.  She had been doing fairly well till January of this year when she had rapidly cycling depression and mania.  Described the mania as excessive energy, little need for sleep. Starting all sorts of projects without following through.  Inappropriate interest in sexual things and spending money for things she did not need.She then lost her job and because of her inability to work at other jobs she started and her mood swings and depression she had to give up custody of her 42 year old son.  This depression has lasted for 6 months and is not responding to medication so far and that is why she is looking for more help.  Currently sad, no energy, poor focus and concentration, feelings of hopelessness, no pleasure in things, avoiding people, ruminating over all kinds of things, no current suicidal thoughts but was hospitalized earlier in the year for suicidal thoughts.  Stressed by no income and  depleting her savings. Elements:  Location:  depression. Quality:  hopelessness. Severity:  cannot work sleeping too much. Timing:  rapid cycling . Duration:  current episode 6 months. Context:  as above. Associated Signs/Symptoms: Depression Symptoms:  depressed mood, anhedonia, insomnia, hypersomnia, psychomotor agitation, fatigue, feelings of worthlessness/guilt, difficulty concentrating, hopelessness, impaired memory, anxiety, (Hypo) Manic Symptoms:  Irritable Mood, Anxiety Symptoms:  Excessive Worry, Psychotic Symptoms:  none PTSD Symptoms: Negative  Past Medical History:  Past Medical History  Diagnosis Date  . Migraine headache   . Bipolar affective disorder, manic, mild   . Anxiety   . PTSD (post-traumatic stress disorder)   . Arthritis     soriatic  . Depression     Past Surgical History  Procedure Laterality Date  . Cholecystectomy    . Cesarean section     Family History:  Family History  Problem Relation Age of Onset  . Depression Father   . Alcohol abuse Sister   . Depression Sister   . Alcohol abuse Maternal Grandfather    Social History:   History   Social History  . Marital Status: Married    Spouse Name: N/A  . Number of Children: N/A  . Years of Education: N/A   Social History Main Topics  . Smoking status: Never Smoker   . Smokeless tobacco: Never Used  . Alcohol Use: No  . Drug Use: No  . Sexual Activity: No   Other Topics Concern  . None   Social History Narrative   Additional Social History: none  Musculoskeletal: Strength & Muscle Tone: within normal limits Gait & Station: normal Patient leans: N/A  Psychiatric Specialty Exam: HPI  ROS  There were no vitals taken for this visit.There is no weight on file to calculate BMI.  General Appearance: Fairly Groomed  Eye Contact:  Good  Speech:  Clear and Coherent  Volume:  Normal  Mood:  Depressed  Affect:  Congruent  Thought Process:  Coherent and Logical   Orientation:  Full (Time, Place, and Person)  Thought Content:  Negative  Suicidal Thoughts:  No  Homicidal Thoughts:  No  Memory:  Immediate;   Good Recent;   Good Remote;   Good  Judgement:  Good  Insight:  Good  Psychomotor Activity:  Normal  Concentration:  Good  Recall:  Good  Fund of Knowledge:Good  Language: Good  Akathisia:  Negative  Handed:  Right  AIMS (if indicated):  0  Assets:  Communication Skills Desire for Improvement Housing Talents/Skills Transportation  ADL's:  Intact  Cognition: WNL  Sleep:  Too little and too much   Is the patient at risk to self?  No. Has the patient been a risk to self in the past 6 months?  Yes.   Has the patient been a risk to self within the distant past?  Yes.   Is the patient a risk to others?  No. Has the patient been a risk to others in the past 6 months?  No. Has the patient been a risk to others within the distant past?  No.  Allergies:  No Known Allergies Current Medications: Current Outpatient Prescriptions  Medication Sig Dispense Refill  . buPROPion (WELLBUTRIN XL) 150 MG 24 hr tablet Take 1 tablet (150 mg total) by mouth daily. For depression 30 tablet 0  . etanercept (ENBREL) 50 MG/ML injection Inject 0.98 mLs (50 mg total) into the skin every Sunday. For Psoraisis 0.98 mL   . lithium carbonate 150 MG capsule Take 150 mg by mouth 2 (two) times daily with a meal. Take 150 mg in the morning and 300 mg in the evening.    . norgestimate-ethinyl estradiol (ORTHO-CYCLEN,SPRINTEC,PREVIFEM) 0.25-35 MG-MCG tablet Take 1 tablet by mouth at bedtime. For Birth control method 1 Package 11  . topiramate (TOPAMAX) 50 MG tablet Take 1 tablet (50 mg total) by mouth at bedtime. For migraine headache (Patient taking differently: Take 100 mg by mouth at bedtime. For migraine headache)    . traZODone (DESYREL) 50 MG tablet Take 1 tablet (50 mg total) by mouth at bedtime. For sleep 30 tablet 0  . ziprasidone (GEODON) 40 MG capsule Take 1  capsule (40 mg total) by mouth daily after supper. For mood control (Patient taking differently: Take 60 mg by mouth daily after supper. For mood control) 30 capsule 0   No current facility-administered medications for this visit.    Previous Psychotropic Medications: Yes   Substance Abuse History in the last 12 months:  No.  Consequences of Substance Abuse: Negative  Medical Decision Making:  Established Problem, Worsening (2)  Treatment Plan Summary: continue current meds and daily group therapy    Benjaman Pott 6/14/20161:48 PM

## 2014-10-07 NOTE — Progress Notes (Signed)
    Daily Group Progress Note  Program: IOP  Group Time: 9:00-10:30  Participation Level: Active  Behavioral Response: Appropriate  Type of Therapy:  Group Therapy  Summary of Progress: Pt. Met with case manager to complete intake.  Presented as depressed, flat affect. Pt. Reported loneliness since son began living with his father six months ago.     Group Time: 11:00-12:00  Participation Level:  Active  Behavioral Response: Appropriate  Type of Therapy: Psycho-education Group  Summary of Progress: Pt. Participated in meditation activity and review of grounding sequence including breathing, legs up the wall, forward fold on the chair to assist with management of anxiety.   Nancie Neas, LPC

## 2014-10-08 ENCOUNTER — Other Ambulatory Visit (HOSPITAL_COMMUNITY): Payer: BLUE CROSS/BLUE SHIELD | Admitting: Psychiatry

## 2014-10-08 DIAGNOSIS — F314 Bipolar disorder, current episode depressed, severe, without psychotic features: Secondary | ICD-10-CM | POA: Diagnosis not present

## 2014-10-08 NOTE — Progress Notes (Signed)
    Daily Group Progress Note  Program: IOP  Group Time: 9:00-10:30  Participation Level: Active  Behavioral Response: Appropriate  Type of Therapy:  Group Therapy  Summary of Progress: Pt. Continues to present as quiet, depressed, flat affect. Pt. Reports that she is challenged by loneliness and grief related to loss of job and loss of custody of her son.      Group Time: 10:30-12:00  Participation Level:  Active  Behavioral Response: Appropriate  Type of Therapy: Psycho-education Group  Summary of Progress: Pt. Participated in guided meditation activity.   Shaune Pollack, LPC

## 2014-10-08 NOTE — Progress Notes (Signed)
    Daily Group Progress Note  Program: IOP  Group Time: 9:00-10:30  Participation Level: Active  Behavioral Response: Appropriate  Type of Therapy:  Group Therapy  Summary of Progress: Pt. Presents as quiet, depressed, flat affect. Pt. Shared pain of losing custody of her son and losing her job due to her mental illness. Pt. Was tearful when expressing anger towards herself for having a mental illness. Pt. Participated in discussion about the role of grief in the acceptance of mental illness.      Group Time: 10:30-12:00  Participation Level:  Active  Behavioral Response: Appropriate  Type of Therapy: Psycho-education Group  Summary of Progress: Pt. Participated in discussion about developing healthy boundaries in relationships with family members.   Shaune Pollack, LPC

## 2014-10-09 ENCOUNTER — Telehealth (HOSPITAL_COMMUNITY): Payer: Self-pay | Admitting: Psychiatry

## 2014-10-09 ENCOUNTER — Other Ambulatory Visit (HOSPITAL_COMMUNITY): Payer: BLUE CROSS/BLUE SHIELD | Admitting: Psychiatry

## 2014-10-10 ENCOUNTER — Other Ambulatory Visit (HOSPITAL_COMMUNITY): Payer: BLUE CROSS/BLUE SHIELD

## 2014-10-10 NOTE — Telephone Encounter (Signed)
Pt phoned this morning and left vm that she wouldn't be attending MH-IOP.  Pt requested writer to call her back to discuss continuation in the program.  Pt stated that she is considering to not return.  A:  Placed call to pt [phone]), but there was no answer.  Left vm for pt to return writer's call today.

## 2014-10-13 ENCOUNTER — Other Ambulatory Visit (HOSPITAL_COMMUNITY): Payer: BLUE CROSS/BLUE SHIELD | Admitting: Psychiatry

## 2014-10-13 DIAGNOSIS — F314 Bipolar disorder, current episode depressed, severe, without psychotic features: Secondary | ICD-10-CM | POA: Diagnosis not present

## 2014-10-13 NOTE — Progress Notes (Signed)
    Daily Group Progress Note  Program: IOP  Group Time: 9:00-10:00  Participation Level: Active  Behavioral Response: Appropriate  Type of Therapy:  Psycho-education Group  Summary of Progress: Pt. Participated in medication education group with [place].     Group Time: 10:00-12:00  Participation Level:  Active  Behavioral Response: Appropriate  Type of Therapy: Group Therapy  Summary of Progress: Pt. Presented with significantly brightened affect, talkative, and appropriate eye contact. Pt. Reported that she wants to work on Physicist, medical and Environmental consultant. Pt. Reported that she understands that she will have to return to work because she needs the money, but concerned about impact on mental health.   Shaune Pollack, LPC

## 2014-10-14 ENCOUNTER — Other Ambulatory Visit (HOSPITAL_COMMUNITY): Payer: BLUE CROSS/BLUE SHIELD

## 2014-10-15 ENCOUNTER — Other Ambulatory Visit (HOSPITAL_COMMUNITY): Payer: BLUE CROSS/BLUE SHIELD | Attending: Psychiatry | Admitting: Psychiatry

## 2014-10-15 DIAGNOSIS — F314 Bipolar disorder, current episode depressed, severe, without psychotic features: Secondary | ICD-10-CM | POA: Diagnosis not present

## 2014-10-15 DIAGNOSIS — F3130 Bipolar disorder, current episode depressed, mild or moderate severity, unspecified: Secondary | ICD-10-CM

## 2014-10-15 NOTE — Progress Notes (Signed)
    Daily Group Progress Note  Program: IOP  Group Time: 9:00-10:30  Participation Level: Minimal  Behavioral Response: Appropriate  Type of Therapy:  Group Therapy  Summary of Progress: Pt. Appeared with significant change in affect compared to [facility] session. Pt. Appeared as quiet, moderately depressed and flat affect.      Group Time: 10:30-12:00  Participation Level:  Active  Behavioral Response: Appropriate  Type of Therapy: Psycho-education Group  Summary of Progress: Pt. Participated in visualization/vision board activity.   Shaune Pollack, LPC

## 2014-10-16 ENCOUNTER — Other Ambulatory Visit (HOSPITAL_COMMUNITY): Payer: BLUE CROSS/BLUE SHIELD

## 2014-10-17 ENCOUNTER — Other Ambulatory Visit (HOSPITAL_COMMUNITY): Payer: BLUE CROSS/BLUE SHIELD | Admitting: Psychiatry

## 2014-10-17 DIAGNOSIS — F314 Bipolar disorder, current episode depressed, severe, without psychotic features: Secondary | ICD-10-CM | POA: Diagnosis not present

## 2014-10-17 DIAGNOSIS — F3130 Bipolar disorder, current episode depressed, mild or moderate severity, unspecified: Secondary | ICD-10-CM

## 2014-10-17 NOTE — Progress Notes (Signed)
    Daily Group Progress Note  Program: IOP  Group Time: 9:00-11:00  Participation Level: Active  Behavioral Response: Appropriate  Type of Therapy:  Group Therapy  Summary of Progress: Pt. Presented as alert, attentive. Pt. Reported that she attended career fair on yesterday and did not receive call backs from employers. Pt. Reported she allowed herself to feel the emotion of disappointment, but recognized that it did not trigger deep depression as in the past. Pt. Recognized this as a significant success in learning to be present with her emotions.      Group Time: 11:00-12:00  Participation Level:  None  Behavioral Response: none  Type of Therapy: Psycho-education Group  Summary of Progress: Pt. Did not attend psychoeducational portion of group.   Shaune Pollack, LPC

## 2014-10-20 ENCOUNTER — Other Ambulatory Visit (HOSPITAL_COMMUNITY): Payer: BLUE CROSS/BLUE SHIELD | Admitting: Psychiatry

## 2014-10-20 DIAGNOSIS — F314 Bipolar disorder, current episode depressed, severe, without psychotic features: Secondary | ICD-10-CM | POA: Diagnosis not present

## 2014-10-20 NOTE — Progress Notes (Signed)
    Daily Group Progress Note  Program: IOP  Group Time: 9:00-10:30  Participation Level: Active  Behavioral Response: Appropriate  Type of Therapy:  Psycho-education Group  Summary of Progress: Pt. Attended medication education group with [facility].     Group Time: 10:30-12:00  Participation Level:  Active  Behavioral Response: Appropriate  Type of Therapy: Group Therapy  Summary of Progress: Pt. Presented as primarily quiet with flat affect. Pt. Reported that she had a good weekend, that she was very busy on Saturday which felt good because she had not been busy in a long time. Pt. Reported that she felt tired on Sunday and did not do much. Pt. Reported that she was doing "ok" today.  Shaune Pollack, LPC

## 2014-10-21 ENCOUNTER — Other Ambulatory Visit (HOSPITAL_COMMUNITY): Payer: BLUE CROSS/BLUE SHIELD

## 2014-10-22 ENCOUNTER — Telehealth (HOSPITAL_COMMUNITY): Payer: Self-pay | Admitting: Psychiatry

## 2014-10-22 ENCOUNTER — Other Ambulatory Visit (HOSPITAL_COMMUNITY): Payer: BLUE CROSS/BLUE SHIELD | Admitting: Psychiatry

## 2014-10-23 ENCOUNTER — Other Ambulatory Visit (HOSPITAL_COMMUNITY): Payer: BLUE CROSS/BLUE SHIELD

## 2014-10-24 ENCOUNTER — Other Ambulatory Visit (HOSPITAL_COMMUNITY): Payer: BLUE CROSS/BLUE SHIELD | Attending: Psychiatry | Admitting: Psychiatry

## 2014-10-24 DIAGNOSIS — F4310 Post-traumatic stress disorder, unspecified: Secondary | ICD-10-CM | POA: Insufficient documentation

## 2014-10-24 DIAGNOSIS — G4700 Insomnia, unspecified: Secondary | ICD-10-CM | POA: Insufficient documentation

## 2014-10-24 DIAGNOSIS — F411 Generalized anxiety disorder: Secondary | ICD-10-CM | POA: Insufficient documentation

## 2014-10-24 DIAGNOSIS — F314 Bipolar disorder, current episode depressed, severe, without psychotic features: Secondary | ICD-10-CM | POA: Insufficient documentation

## 2014-10-24 NOTE — Progress Notes (Signed)
    Daily Group Progress Note  Program: IOP  Group Time: 0900-1045  Participation Level: Active  Behavioral Response: Appropriate and Sharing  Type of Therapy:  Group Therapy  Summary of Progress: Pt described herself as being a "weathered canvas" today.  States she has been having periods of gagging, for no apparent reason.  "There's nothing in my throat to make me gag.  So it dawned on me the other day, that it is from past trauma."  Pt wouldn't disclose the past trauma in group, but c/o nightmares returning.  Pt has upcoming appt to see her trauma therapist next week. Pt was able to verbalize how she will structure her long weekend with various activities.     Group Time: 1100-1200  Participation Level:  Active  Behavioral Response: Appropriate and Sharing  Type of Therapy: Psycho-education Group  Summary of Progress: Pt discussed grief/loss issues with Theda BelfastBob Hamilton, M.Div.   Chestine SporeLARK, RITA, CNA, M.Ed

## 2014-10-28 ENCOUNTER — Other Ambulatory Visit (HOSPITAL_COMMUNITY): Payer: BLUE CROSS/BLUE SHIELD

## 2014-10-29 ENCOUNTER — Other Ambulatory Visit (HOSPITAL_COMMUNITY): Payer: BLUE CROSS/BLUE SHIELD | Admitting: Psychiatry

## 2014-10-29 DIAGNOSIS — F411 Generalized anxiety disorder: Secondary | ICD-10-CM | POA: Diagnosis not present

## 2014-10-29 DIAGNOSIS — F3130 Bipolar disorder, current episode depressed, mild or moderate severity, unspecified: Secondary | ICD-10-CM

## 2014-10-29 DIAGNOSIS — G4700 Insomnia, unspecified: Secondary | ICD-10-CM | POA: Diagnosis not present

## 2014-10-29 DIAGNOSIS — F314 Bipolar disorder, current episode depressed, severe, without psychotic features: Secondary | ICD-10-CM | POA: Diagnosis present

## 2014-10-29 DIAGNOSIS — F4310 Post-traumatic stress disorder, unspecified: Secondary | ICD-10-CM | POA: Diagnosis not present

## 2014-10-29 NOTE — Patient Instructions (Signed)
Patient completed MH-IOP today.  Will follow up with Dr. Robet Leuhotakura in August 2016  and [facility]ara [facility], [facility] on 10-30-14 @ 9 a.m.  Encouraged support groups.

## 2014-10-29 NOTE — Progress Notes (Signed)
    Daily Group Progress Note  Program: IOP  Group Time:  9:00-10:30  Participation Level: Active  Behavioral Response: Appropriate  Type of Therapy:  Group Therapy  Summary of Progress: Pt. Presented with brightened affect, talked and made appropriate eye contact. Pt. Participated in discharge planning. Pt. Reported that she was continuing to look for a job and working on Photographerdeveloping patience and accepting her life.      Group Time: 10:30-12:00  Participation Level:  Active  Behavioral Response: Appropriate  Type of Therapy: Psycho-education Group  Summary of Progress: Pt. Participated in guided meditation, reflective reading,and instruction on 4-3-8 breathing.   Boneta LucksJennifer Jerrica Thorman, Ph.D., [facility]

## 2014-10-29 NOTE — Progress Notes (Signed)
Patient ID: Jake ChurchChrista Pereida, female   DOB: 05/17/1970, 44 y.o.   MRN: [id number] Discharge Note  Patient:  Jake ChurchChrista Lui is an 44 y.o., female DOB:  01/26/1971  Date of Admission:  10/06/2014  Date of Discharge: 10/29/2014  Reason for Admission:depression   IOP Course:  Ms Dante GangWhitesell attended groups and was participatory.  She said it was very beneficial in that it gave her structure when she needed it, the skills training was very helpful and gave the increased medication time to work   She looks more relaxed and happy and says she feels the same.  Mental Status at Discharge:no suicidal thoughts, happier and optimistic about her future  Lab Results: No results found for this or any previous visit (from the past 48 hour(s)).   Current outpatient prescriptions:  .  buPROPion (WELLBUTRIN XL) 150 MG 24 hr tablet, Take 1 tablet (150 mg total) by mouth daily. For depression, Disp: 30 tablet, Rfl: 0 .  etanercept (ENBREL) 50 MG/ML injection, Inject 0.98 mLs (50 mg total) into the skin every Sunday. For Psoraisis, Disp: 0.98 mL, Rfl:  .  lithium carbonate 150 MG capsule, Take 150 mg by mouth 2 (two) times daily with a meal. Take 150 mg in the morning and 300 mg in the evening., Disp: , Rfl:  .  norgestimate-ethinyl estradiol (ORTHO-CYCLEN,SPRINTEC,PREVIFEM) 0.25-35 MG-MCG tablet, Take 1 tablet by mouth at bedtime. For Birth control method, Disp: 1 Package, Rfl: 11 .  topiramate (TOPAMAX) 50 MG tablet, Take 1 tablet (50 mg total) by mouth at bedtime. For migraine headache (Patient taking differently: Take 100 mg by mouth at bedtime. For migraine headache), Disp: , Rfl:  .  traZODone (DESYREL) 50 MG tablet, Take 1 tablet (50 mg total) by mouth at bedtime. For sleep, Disp: 30 tablet, Rfl: 0 .  ziprasidone (GEODON) 40 MG capsule, Take 1 capsule (40 mg total) by mouth daily after supper. For mood control (Patient taking differently: Take 60 mg by mouth daily after supper. For mood control), Disp: 30  capsule, Rfl: 0  Axis Diagnosis:  Bipolar disorder, MRE depressed  Severe, currently mild   Level of Care:  IOP  Discharge destination:  Other:  return to Dr Robet Leuhotakura and Ms Maple HudsonYoung for outpatient psychiatry and therapy  Is patient on multiple antipsychotic therapies at discharge:  No    Has Patient had three or more failed trials of antipsychotic monotherapy by history:  Negative  Patient phone:  [phone] (home)  Patient address:   [address] [place] [address],   Follow-up recommendations:  Activity:  continue current activity Diet:  continue current diet  Comments:  Doing better  The patient received suicide prevention pamphlet:  Yes Belongings returned:  na  Carolanne GrumblingGerald Vermell [place] 10/29/2014, 5:28 PM

## 2014-10-29 NOTE — Progress Notes (Signed)
[name] is a 44 y.o. , separated, Caucasian female, who was a self referral. Pt is well known to Clinical research associatewriter; due to previous MH-IOP admission on 07-01-13. Reported worsening depressive and anxiety symptoms. Other symptoms included: Increased sleep, isolating, poor concentration, tearfulness, anhedonia, no energy, no motivation, ruminating thoughts, poor appetite (has lost 25 lbs since January 2016), and feelings of hopelessness. Stated she started rapid cycling in January 2016; and [facility]. Denied SI/HI or A/V hallucinations. Pt completed MH-IOP today.  Reports feeling much better.  States she is sleeping better.  Continues to struggle with poor appetite and low energy.  Affect is brighter.  Denies SI/HI or A/V hallucinations.  Reports that the groups were beneficial.  "I learned a lot of coping strategies."  A:  D/C today.  F/U with Dr. Robet Leuhotakura in August 2016 and Ver[facility]Sara Young, [facility] on 10-30-14 @ 9 a.m.  Encouraged support groups.  R:  Pt receptive.  Chestine SporeLARK, RITA, CNA, M.Ed

## 2014-10-30 ENCOUNTER — Other Ambulatory Visit (HOSPITAL_COMMUNITY): Payer: BLUE CROSS/BLUE SHIELD

## 2014-10-31 ENCOUNTER — Other Ambulatory Visit (HOSPITAL_COMMUNITY): Payer: BLUE CROSS/BLUE SHIELD

## 2014-11-03 ENCOUNTER — Other Ambulatory Visit (HOSPITAL_COMMUNITY): Payer: BLUE CROSS/BLUE SHIELD

## 2015-04-22 DIAGNOSIS — G43711 Chronic migraine without aura, intractable, with status migrainosus: Secondary | ICD-10-CM | POA: Insufficient documentation

## 2015-04-22 DIAGNOSIS — G2581 Restless legs syndrome: Secondary | ICD-10-CM | POA: Insufficient documentation

## 2015-04-22 DIAGNOSIS — E559 Vitamin D deficiency, unspecified: Secondary | ICD-10-CM | POA: Insufficient documentation

## 2015-04-22 DIAGNOSIS — M5430 Sciatica, unspecified side: Secondary | ICD-10-CM | POA: Insufficient documentation

## 2016-01-08 ENCOUNTER — Encounter (HOSPITAL_COMMUNITY): Payer: Self-pay | Admitting: Emergency Medicine

## 2016-01-08 ENCOUNTER — Emergency Department (HOSPITAL_COMMUNITY)
Admission: EM | Admit: 2016-01-08 | Discharge: 2016-01-08 | Disposition: A | Discharge status: Home / Self Care | Payer: BC Managed Care – PPO | Attending: Emergency Medicine | Admitting: Emergency Medicine

## 2016-01-08 ENCOUNTER — Emergency Department (HOSPITAL_COMMUNITY): Payer: BC Managed Care – PPO

## 2016-01-08 DIAGNOSIS — F419 Anxiety disorder, unspecified: Secondary | ICD-10-CM | POA: Diagnosis not present

## 2016-01-08 DIAGNOSIS — R110 Nausea: Secondary | ICD-10-CM | POA: Diagnosis not present

## 2016-01-08 DIAGNOSIS — Z79899 Other long term (current) drug therapy: Secondary | ICD-10-CM | POA: Diagnosis not present

## 2016-01-08 DIAGNOSIS — R0789 Other chest pain: Secondary | ICD-10-CM | POA: Insufficient documentation

## 2016-01-08 DIAGNOSIS — R072 Precordial pain: Secondary | ICD-10-CM | POA: Diagnosis present

## 2016-01-08 DIAGNOSIS — R0602 Shortness of breath: Secondary | ICD-10-CM | POA: Diagnosis not present

## 2016-01-08 DIAGNOSIS — R079 Chest pain, unspecified: Secondary | ICD-10-CM

## 2016-01-08 DIAGNOSIS — Z791 Long term (current) use of non-steroidal anti-inflammatories (NSAID): Secondary | ICD-10-CM | POA: Insufficient documentation

## 2016-01-08 LAB — COMPREHENSIVE METABOLIC PANEL
ALT: 10 U/L — AB (ref 14–54)
AST: 15 U/L (ref 15–41)
Albumin: 3.9 g/dL (ref 3.5–5.0)
Alkaline Phosphatase: 51 U/L (ref 38–126)
Anion gap: 6 (ref 5–15)
BUN: 10 mg/dL (ref 6–20)
CHLORIDE: 110 mmol/L (ref 101–111)
CO2: 24 mmol/L (ref 22–32)
CREATININE: 0.98 mg/dL (ref 0.44–1.00)
Calcium: 9.4 mg/dL (ref 8.9–10.3)
GFR calc Af Amer: >60 mL/min (ref >60)
Glucose, Bld: 101 mg/dL — ABNORMAL HIGH (ref 65–99)
POTASSIUM: 4.3 mmol/L (ref 3.5–5.1)
SODIUM: 140 mmol/L (ref 135–145)
Total Bilirubin: 0.3 mg/dL (ref 0.3–1.2)
Total Protein: 6.9 g/dL (ref 6.5–8.1)

## 2016-01-08 LAB — CBC
HCT: 39.5 % (ref 36.0–46.0)
Hemoglobin: 13.6 g/dL (ref 12.0–15.0)
MCH: 32.7 pg (ref 26.0–34.0)
MCHC: 34.4 g/dL (ref 30.0–36.0)
MCV: 95 fL (ref 78.0–100.0)
PLATELETS: 261 10*3/uL (ref 150–400)
RBC: 4.16 MIL/uL (ref 3.87–5.11)
RDW: 12.8 % (ref 11.5–15.5)
WBC: 9.6 10*3/uL (ref 4.0–10.5)

## 2016-01-08 LAB — I-STAT TROPONIN, ED
TROPONIN I, POC: 0 ng/mL (ref 0.00–0.08)
Troponin i, poc: 0 ng/mL (ref 0.00–0.08)

## 2016-01-08 MED ORDER — DIAZEPAM 5 MG PO TABS
5.0000 mg | ORAL_TABLET | Freq: Once | ORAL | Status: AC
  Administered 2016-01-08: 5 mg via ORAL
  Filled 2016-01-08: qty 1

## 2016-01-08 NOTE — ED Notes (Signed)
Soda  given to patient. OK per MD

## 2016-01-08 NOTE — ED Notes (Signed)
2 failed attmpt to collect blood samples

## 2016-01-08 NOTE — ED Notes (Signed)
Bed: WA03 Expected date:  Expected time:  Means of arrival:  Comments: EMS- anxiety

## 2016-01-08 NOTE — ED Notes (Signed)
Patient transported to X-ray 

## 2016-01-08 NOTE — ED Provider Notes (Signed)
[facility]-EMERGENCY DEPT Provider Note   CSN: [id number] Arrival date & time: 01/08/16  7829     History   Chief Complaint Chief Complaint  Patient presents with  . Chest Pain  . Anxiety    HPI Katelyn [name] is a 45 y.o. female.  The history is provided by the patient.  Chest Pain   This is a new problem. The current episode started 3 to 5 hours ago. The problem occurs constantly (fluctuating). The pain is associated with an emotional upset. The pain is present in the substernal region. The pain is moderate. The quality of the pain is described as pressure-like. The pain does not radiate. Associated symptoms include nausea and shortness of breath (only when anxious). Pertinent negatives include no abdominal pain, no back pain, no cough, no diaphoresis, no fever, no headaches, no palpitations and no vomiting.  Pertinent negatives for past medical history include no CAD, no COPD, no CHF, no diabetes, no DVT, no hyperlipidemia, no hypertension, no MI, no PE and no strokes.  Procedure history is negative for cardiac catheterization and stress echo.  Anxiety  This is a recurrent problem. The current episode started 2 days ago. The problem occurs daily. Associated symptoms include chest pain and shortness of breath (only when anxious). Pertinent negatives include no abdominal pain and no headaches.  severely stressed from work related issues. Awoke this am, stressed and when she felt the pain, it made her anxiety worse, which worsened her pain. Feels pain is related to the anxiety itself.  Is on OCP, but no recent travels, surgeries, leg swelling/pain, no prior DVT or PE.  Past Medical History:  Diagnosis Date  . Anxiety   . Arthritis    soriatic  . Bipolar affective disorder, manic, mild (HCC)   . Depression   . Migraine headache   . PTSD (post-traumatic stress disorder)     Patient Active Problem List   Diagnosis Date Noted  . Bipolar disorder, current episode depressed, severe,  without psychotic features (HCC)   . GAD (generalized anxiety disorder) 04/28/2014  . Depressive episode 04/26/2014  . Suicidal ideation   . Bipolar affective disorder (HCC)   . Bipolar 1 disorder with moderate mania (HCC) 02/11/2014  . Bipolar disorder, unspecified (HCC) 06/15/2013  . Polypharmacy 01/08/2013  . Psoriatic arthropathy (HCC) 01/24/2012  . Psoriasis 08/05/2011  . 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 Post traumatic stress disorder (PTSD) 04/10/2011  . Generalized anxiety disorder 04/10/2011    Past Surgical History:  Procedure Laterality Date  . CESAREAN SECTION    . CHOLECYSTECTOMY      OB History    No data available       Home Medications    Prior to Admission medications   Medication Sig Start Date End Date Taking? Authorizing Provider  buPROPion (WELLBUTRIN XL) 150 MG 24 hr tablet Take 1 tablet (150 mg total) by mouth daily. For depression Patient taking differently: Take 225 mg by mouth daily. For depression 05/02/14  Yes Sanjuana Kava, NP  cetirizine (ZYRTEC) 10 MG tablet Take 10 mg by mouth daily as needed for allergies.   Yes Historical Provider, MD  clobetasol (OLUX) 0.05 % topical foam Apply 1 application topically 2 (two) times daily. 12/22/15  Yes Historical Provider, MD  lithium carbonate 150 MG capsule Take 150 mg by mouth 2 (two) times daily with a meal. Take 150 mg in the morning and 300 mg in the evening.   Yes Historical Provider, MD  mirtazapine (REMERON) 15  MG tablet Take 15 mg by mouth at bedtime. 12/10/15  Yes Historical Provider, MD  norgestimate-ethinyl estradiol (ORTHO-CYCLEN,SPRINTEC,PREVIFEM) 0.25-35 MG-MCG tablet Take 1 tablet by mouth at bedtime. For Birth control method 05/02/14  Yes Sanjuana KavaAgnes I Nwoko, NP  SUMAtriptan (IMITREX) 100 MG tablet TAKE 1 TABLET BY MOUTH FOR MIGRAINE RELIEF, MAY REPEAT IN 2 HRS LATER MAX OF 2 TABS IN 24 HOURS 12/21/15  Yes Historical Provider, MD  topiramate (TOPAMAX) 50 MG tablet Take 1 tablet (50 mg total) by  mouth at bedtime. For migraine headache Patient taking differently: Take 100 mg by mouth at bedtime. For migraine headache 05/02/14  Yes Sanjuana KavaAgnes I Nwoko, NP  ziprasidone (GEODON) 60 MG capsule Take 60 mg by mouth every evening.   Yes Historical Provider, MD  etanercept (ENBREL) 50 MG/ML injection Inject 0.98 mLs (50 mg total) into the skin every Sunday. For Psoraisis Patient taking differently: Inject 50 mg into the skin 2 (two) times a week. For Psoraisis-- On Saturday and Tuesday. 05/02/14   Sanjuana KavaAgnes I Nwoko, NP  traZODone (DESYREL) 50 MG tablet Take 1 tablet (50 mg total) by mouth at bedtime. For sleep Patient not taking: Reported on 01/08/2016 05/02/14   Sanjuana KavaAgnes I Nwoko, NP  ziprasidone (GEODON) 40 MG capsule Take 1 capsule (40 mg total) by mouth daily after supper. For mood control Patient not taking: Reported on 01/08/2016 05/02/14   Sanjuana KavaAgnes I Nwoko, NP    Family History Family History  Problem Relation Age of Onset  . Depression Father   . Alcohol abuse Sister   . Depression Sister   . Alcohol abuse Maternal Grandfather     Social History Social History  Substance Use Topics  . Smoking status: Never Smoker  . Smokeless tobacco: Never Used  . Alcohol use No     Allergies   Review of patient's allergies indicates no known allergies.   Review of Systems Review of Systems  Constitutional: Negative for chills, diaphoresis, fatigue and fever.  HENT: Negative for congestion and sore throat.   Eyes: Negative for visual disturbance.  Respiratory: Positive for shortness of breath (only when anxious). Negative for cough and chest tightness.   Cardiovascular: Positive for chest pain. Negative for palpitations.  Gastrointestinal: Positive for nausea. Negative for abdominal pain, blood in stool, diarrhea and vomiting.  Genitourinary: Negative for decreased urine volume and difficulty urinating.  Musculoskeletal: Negative for back pain and neck stiffness.  Skin: Negative for rash.  Neurological:  Negative for light-headedness and headaches.  Psychiatric/Behavioral: Negative for confusion.  All other systems reviewed and are negative.    Physical Exam Updated Vital Signs BP 108/59 (BP Location: Right Arm)   Pulse 82   Temp 98.7 F (37.1 C) (Oral)   Resp 18   Ht 5\' 2"  (1.575 m)   Wt 148 lb (67.1 kg)   LMP 12/27/2015   SpO2 97%   BMI 27.07 kg/m   Physical Exam  Constitutional: She is oriented to person, place, and time. She appears well-developed and well-nourished. No distress.  HENT:  Head: Normocephalic and atraumatic.  Nose: Nose normal.  Eyes: Conjunctivae and EOM are normal. Pupils are equal, round, and reactive to light. Right eye exhibits no discharge. Left eye exhibits no discharge. No scleral icterus.  Neck: Normal range of motion. Neck supple.  Cardiovascular: Normal rate and regular rhythm.  Exam reveals no gallop and no friction rub.   No murmur heard. Pulmonary/Chest: Effort normal and breath sounds normal. No stridor. No respiratory distress. She has no  rales.  Abdominal: Soft. She exhibits no distension. There is no tenderness.  Musculoskeletal: She exhibits no edema or tenderness.  Neurological: She is alert and oriented to person, place, and time.  Skin: Skin is warm and dry. No rash noted. She is not diaphoretic. No erythema.  Psychiatric: She has a normal mood and affect.  Vitals reviewed.    ED Treatments / Results  Labs (all labs ordered are listed, but only abnormal results are displayed) Labs Reviewed  COMPREHENSIVE METABOLIC PANEL - Abnormal; Notable for the following:       Result Value   Glucose, Bld 101 (*)    ALT 10 (*)    All other components within normal limits  CBC  I-STAT TROPOININ, ED  Rosezena Sensor, ED    EKG  EKG Interpretation  Date/Time:  Friday January 08 2016 08:54:50 EDT Ventricular Rate:  83 PR Interval:    QRS Duration: 94 QT Interval:  387 QTC Calculation: 455 R Axis:   44 Text Interpretation:  Sinus  rhythm Confirmed by [facility] MD, Ty Oshima ([id number]) on 01/08/2016 11:12:09 AM       Radiology Dg Chest 2 View  Result Date: 01/08/2016 CLINICAL DATA:  Patient old this morning with anxiety and mid chest pain. EXAM: CHEST  2 VIEW COMPARISON:  No comparison studies available. FINDINGS: The lungs are clear wiithout focal pneumonia, edema, pneumothorax or pleural effusion. The cardiopericardial silhouette is within normal limits for size. The visualized bony structures of the thorax are intact. Telemetry leads overlie the chest. IMPRESSION: No active cardiopulmonary disease. Electronically Signed   By: Kennith Center M.D.   On: 01/08/2016 09:12    Procedures Procedures (including critical care time)  Medications Ordered in ED Medications  diazepam (VALIUM) tablet 5 mg (5 mg Oral Given 01/08/16 1339)     Initial Impression / Assessment and Plan / ED Course  I have reviewed the triage vital signs and the nursing notes.  Pertinent labs & imaging results that were available during my care of the patient were reviewed by me and considered in my medical decision making (see chart for details).  Clinical Course   Atypical chest pain. Likely secondary to anxiety, but since pain is new and pt is HEAR of 2, will rule out ACS with delta trop. Low pretest probability for PE. Presentation is not classic for aortic dissection or esophageal perforation.  EKG without acute ischemia. Chest x-ray without evidence suggestive of pneumonia, pneumothorax, pneumomediastinum.  No abnormal contour of the mediastinum to suggest dissection. No evidence of acute injuries. Initial troponin negative. Delta troponin negative.  Patient given Valium for anxiety.  She is safe for discharge with PCP follow-up.  Final Clinical Impressions(s) / ED Diagnoses   Final diagnoses:  Chest pain  Anxiety  Chest pain, unspecified chest pain type   Disposition: Discharge  Condition: Good  I have discussed the results, Dx and Tx  plan with the patient who expressed understanding and agree(s) with the plan. Discharge instructions discussed at great length. The patient was given strict return precautions who verbalized understanding of the instructions. No further questions at time of discharge.    Current Discharge Medication List      Follow Up: Laroy Apple, PA-C [address] [place] [address] [phone]  Call  As needed      Nira Conn, MD 01/08/16 1401

## 2016-01-08 NOTE — ED Triage Notes (Signed)
Pt reports her anxiety has been progressing for a few days but the actual chest pain began today.

## 2016-01-08 NOTE — ED Triage Notes (Signed)
Patient is complaining of chest pain related to anxiety per patient.  Patient reports tingling to right arm.  She has chest pressure and after relaxing her pressure subsided to a 3.    Denies LOC, trauma, and SOB at onset.  SOB has resided.  BP:112/78 P: 90 R:16 O2:97% on room air  CBG:93

## 2016-01-13 ENCOUNTER — Encounter (HOSPITAL_COMMUNITY): Payer: Self-pay

## 2016-01-13 ENCOUNTER — Observation Stay (HOSPITAL_COMMUNITY)
Admission: AD | Admit: 2016-01-13 | Discharge: 2016-01-14 | Disposition: A | Discharge status: Home / Self Care | Payer: BC Managed Care – PPO | Attending: Psychiatry | Admitting: Psychiatry

## 2016-01-13 DIAGNOSIS — F314 Bipolar disorder, current episode depressed, severe, without psychotic features: Secondary | ICD-10-CM | POA: Diagnosis not present

## 2016-01-13 DIAGNOSIS — F411 Generalized anxiety disorder: Secondary | ICD-10-CM | POA: Insufficient documentation

## 2016-01-13 DIAGNOSIS — F332 Major depressive disorder, recurrent severe without psychotic features: Secondary | ICD-10-CM | POA: Diagnosis present

## 2016-01-13 DIAGNOSIS — F4310 Post-traumatic stress disorder, unspecified: Secondary | ICD-10-CM | POA: Insufficient documentation

## 2016-01-13 DIAGNOSIS — Z818 Family history of other mental and behavioral disorders: Secondary | ICD-10-CM | POA: Diagnosis not present

## 2016-01-13 DIAGNOSIS — R45851 Suicidal ideations: Secondary | ICD-10-CM | POA: Diagnosis not present

## 2016-01-13 DIAGNOSIS — L4050 Arthropathic psoriasis, unspecified: Secondary | ICD-10-CM | POA: Diagnosis not present

## 2016-01-13 MED ORDER — ACETAMINOPHEN 325 MG PO TABS: 650.0000 mg | ORAL_TABLET | Freq: Four times a day (QID) | ORAL | Status: DC | PRN

## 2016-01-13 MED ORDER — LITHIUM CARBONATE 300 MG PO CAPS
300.0000 mg | ORAL_CAPSULE | Freq: Every day | ORAL | Status: DC
  Administered 2016-01-14: 300 mg via ORAL
  Filled 2016-01-13: qty 2
  Filled 2016-01-13: qty 1

## 2016-01-13 MED ORDER — LITHIUM CARBONATE 300 MG PO TABS
600.0000 mg | ORAL_TABLET | Freq: Every day | ORAL | Status: DC
  Administered 2016-01-13: 600 mg via ORAL
  Filled 2016-01-13 (×3): qty 2

## 2016-01-13 MED ORDER — MIRTAZAPINE 15 MG PO TABS
15.0000 mg | ORAL_TABLET | Freq: Every day | ORAL | Status: DC
  Administered 2016-01-13: 15 mg via ORAL
  Filled 2016-01-13: qty 1

## 2016-01-13 MED ORDER — TRAZODONE HCL 50 MG PO TABS
50.0000 mg | ORAL_TABLET | Freq: Every day | ORAL | Status: DC
  Administered 2016-01-13: 50 mg via ORAL
  Filled 2016-01-13: qty 1

## 2016-01-13 MED ORDER — HYDROXYZINE HCL 25 MG PO TABS: 25.0000 mg | ORAL_TABLET | Freq: Four times a day (QID) | ORAL | Status: DC | PRN

## 2016-01-13 MED ORDER — BUPROPION HCL ER (XL) 300 MG PO TB24
300.0000 mg | ORAL_TABLET | Freq: Every day | ORAL | Status: DC
  Administered 2016-01-14: 300 mg via ORAL
  Filled 2016-01-13: qty 1

## 2016-01-13 MED ORDER — LORATADINE 10 MG PO TABS
10.0000 mg | ORAL_TABLET | Freq: Every day | ORAL | Status: DC
  Administered 2016-01-14: 10 mg via ORAL
  Filled 2016-01-13: qty 1

## 2016-01-13 MED ORDER — MAGNESIUM HYDROXIDE 400 MG/5ML PO SUSP: 30.0000 mL | Freq: Every day | ORAL | Status: DC | PRN

## 2016-01-13 MED ORDER — ALUM & MAG HYDROXIDE-SIMETH 200-200-20 MG/5ML PO SUSP: 30.0000 mL | ORAL | Status: DC | PRN

## 2016-01-13 MED ORDER — NORGESTIMATE-ETH ESTRADIOL 0.25-35 MG-MCG PO TABS
1.0000 | ORAL_TABLET | Freq: Every day | ORAL | Status: DC
  Administered 2016-01-13: 1 via ORAL

## 2016-01-13 MED ORDER — CLOBETASOL PROPIONATE 0.05 % EX FOAM
1.0000 "application " | Freq: Two times a day (BID) | CUTANEOUS | Status: DC
  Filled 2016-01-13: qty 1
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
## 2016-01-13 NOTE — [facility] Counselor (Signed)
This Clinical research associatewriter spoke with pt upon admission to OBS dept. Pt informed this Clinical research associatewriter that she has been dealing with thoughts of SI for the past 10 days and it has recently become worse as she seen her Psychiatrist Sallyanne Kuster(Uma Thotakura) on yesterday who recommended that she come in for an assessment.  Pt states that she wasn't going to act on the thoughts, but she couldn't get rid of the thoughts. Pt also denies that she had a plan.  Pt denies HI, but reports that she is crashing from a manic episode and her Psychiatrist recently made some adjustments to her medications. Pt is open to having her medications adjusted again while in the OBS unit and is receptive to attending an Intensive Outpatient Program to assist her with managing her mental health diagnosis. Pt also reports that she also sees a therapist Verlan Friends(Sara Young) once a month, but she is open to seeing her more frequently due to her recent issues with depression and manic episodes. This Clinical research associatewriter informed this pt that she will schedule a follow up appointment for this pt with the [facility] program downstairs, and that a  7-10 day follow up will also be scheduled for her with her therapist. Pt was receptive of this information.

## 2016-01-13 NOTE — H&P (Signed)
Behavioral Health Medical Screening Exam  Katelyn [name] is an 45 y.o. female.  Total Time spent with patient: 30 minutes  Psychiatric Specialty Exam: Physical Exam  Review of Systems  Psychiatric/Behavioral: Positive for depression and suicidal ideas. Negative for hallucinations and substance abuse. The patient is nervous/anxious and has insomnia.   All other systems reviewed and are negative.   Last menstrual period 12/27/2015.There is no height or weight on file to calculate BMI.  General Appearance: Casual and Fairly Groomed  Eye Contact:  Fair  Speech:  Clear and Coherent and Normal Rate  Volume:  Normal  Mood:  Anxious and Depressed  Affect:  Appropriate, Congruent and Depressed  Thought Process:  Coherent, Goal Directed, Linear and Descriptions of Associations: Intact  Orientation:  Full (Time, Place, and Person)  Thought Content:  Symptoms, worries, concerns  Suicidal Thoughts:  Yes.  without intent/plan  Homicidal Thoughts:  No  Memory:  Immediate;   Fair Recent;   Fair Remote;   Fair  Judgement:  Fair  Insight:  Fair  Psychomotor Activity:  Normal  Concentration: Concentration: Fair and Attention Span: Fair  Recall:  [employer]  Fund of Knowledge:Fair  Language: Fair  Akathisia:  No  Handed:    AIMS (if indicated):     Assets:  Communication Skills Desire for Improvement Physical Health Resilience Social Support  Sleep:       Musculoskeletal: Strength & Muscle Tone: within normal limits Gait & Station: normal Patient leans: N/A  Last menstrual period 12/27/2015. VS will be in doc flowsheets.   Recommendations:  Based on my evaluation the patient does not appear to have an emergency medical condition.  Beau FannyWithrow, John C, FNP 01/13/2016, 2:16 PM   Agree with NP assessment

## 2016-01-13 NOTE — [facility] Counselor (Addendum)
This Clinical research associatewriter has scheduled this pt an appointment on January 18, 2016 @ 8:45am with Jeri Modenaita Clark for the [facility] program. Pt was receptive of this information.
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
## 2016-01-14 DIAGNOSIS — F332 Major depressive disorder, recurrent severe without psychotic features: Secondary | ICD-10-CM | POA: Diagnosis not present

## 2016-01-14 DIAGNOSIS — F314 Bipolar disorder, current episode depressed, severe, without psychotic features: Secondary | ICD-10-CM | POA: Diagnosis not present

## 2016-01-14 MED ORDER — NORGESTIMATE-ETH ESTRADIOL 0.25-35 MG-MCG PO TABS: 1.0000 | ORAL_TABLET | Freq: Every day | ORAL | 0 refills | Status: DC

## 2016-01-14 MED ORDER — HYDROXYZINE HCL 25 MG PO TABS: 25.0000 mg | ORAL_TABLET | Freq: Four times a day (QID) | ORAL | 0 refills | Status: DC | PRN

## 2016-01-14 MED ORDER — TRAZODONE HCL 50 MG PO TABS: 50.0000 mg | ORAL_TABLET | Freq: Every day | ORAL | 0 refills | Status: DC

## 2016-01-14 NOTE — H&P (Signed)
[facility] OBS UNIT H&P (due to short LOS, pt seen once)  Patient Identification: Katelyn [name] MRN:  [id number] Principal Diagnosis: MDD (major depressive disorder), recurrent severe, without psychosis (HCC) Diagnosis:   Patient Active Problem List   Diagnosis Date Noted  . MDD (major depressive disorder), recurrent severe, without psychosis (HCC) [F33.2] 01/13/2016    Priority: High  . Bipolar disorder, current episode depressed, severe, without psychotic features (HCC) [F31.4]   . GAD (generalized anxiety disorder) [F41.1] 04/28/2014  . Depressive episode [F32.9] 04/26/2014  . Suicidal ideation [R45.851]   . Bipolar affective disorder (HCC) [F31.9]   . Bipolar 1 disorder with moderate mania (HCC) [F31.12] 02/11/2014  . Bipolar disorder, unspecified (HCC) [F31.9] 06/15/2013  . Polypharmacy [Z79.899] 01/08/2013  . 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 Psoriasis [L40.9] 08/05/2011  . 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 Post traumatic stress disorder (PTSD) [F43.10] 04/10/2011  . Generalized anxiety disorder [F41.1] 04/10/2011    Total Time spent with patient: 45 minutes  Subjective:   Katelyn [name] is a 45 y.o. female patient admitted with reports of suicidal ideation, generalized, without plan. Pt seen and chart reviewed. Pt is alert/oriented x4, calm, cooperative, and appropriate to situation. Pt denies suicidal/homicidal ideation and psychosis and does not appear to be responding to internal stimuli. Pt reports major improvement in symptoms and has resources for outpatient.   HPI: I have reviewed and concur with HPI elements below, modified as follows: Katelyn [name] is an 45 y.o. female. Pt reports SI with no plan. Pt reports 3 previous SI attempts. Pt denies HI. Pt denies AVH. Pt reports increased depression. Pt reports the following depressive symptoms: loss of interest in activities, decreased sleep, loss of appetite, and irritability.  Pt states  she receiving medication management and therapy. Pt reports multiple hospitalizations. Pt is prescribed Mirtzapine, Bupropion, Toprimate, Lithium, Alprazolam, and Sprintic. Pt denies SA. Pt denies abuse.   Pt spent the night in OBS, arriving late yesterday. She has been participating without incident.   Past Psychiatric History: MDD  Risk to Self: Suicidal Ideation: Yes-Currently Present Suicidal Intent: No Is patient at risk for suicide?: Yes Suicidal Plan?: No Access to Means: No What has been your use of drugs/alcohol within the last 12 months?: NA How many times?: 0 Other Self Harm Risks: NA Triggers for Past Attempts: None known Intentional Self Injurious Behavior: None Risk to Others: Homicidal Ideation: No Thoughts of Harm to Others: No Current Homicidal Intent: No Current Homicidal Plan: No Access to Homicidal Means: No Identified Victim: NA History of harm to others?: No Assessment of Violence: None Noted Violent Behavior Description: NA Does patient have access to weapons?: No Criminal Charges Pending?: No Does patient have a court date: No Prior Inpatient Therapy: Prior Inpatient Therapy: Yes Prior Therapy Dates: multiplel last 2016 Prior Therapy Facilty/Provider(s): [facility] Reason for Treatment: depression Prior Outpatient Therapy: Prior Outpatient Therapy: Yes Prior Therapy Dates: current Prior Therapy Facilty/Provider(s): Dr. Alene Mires Reason for Treatment: depression Does patient have an ACCT team?: No Does patient have Intensive In-House Services?  : No Does patient have [facility] services? : No Does patient have P4CC services?: No  Past Medical History:  Past Medical History:  Diagnosis Date  . Anxiety   . Arthritis    soriatic  . Bipolar affective disorder, manic, mild (HCC)   . Depression   . Migraine headache   . PTSD (post-traumatic stress disorder)     Past Surgical History:  Procedure Laterality Date  . CESAREAN SECTION    .  CHOLECYSTECTOMY      Family History:  Family History  Problem Relation Age of Onset  . Depression Father   . Alcohol abuse Sister   . Depression Sister   . Alcohol abuse Maternal Grandfather    Family Psychiatric  History: MDD Social History:  History  Alcohol Use No     History  Drug Use No    Social History   Social History  . Marital status: Married    Spouse name: N/A  . Number of children: N/A  . Years of education: N/A   Social History Main Topics  . Smoking status: Never Smoker  . Smokeless tobacco: Never Used  . Alcohol use No  . Drug use: No  . Sexual activity: Yes    Birth control/ protection: Pill   Other Topics Concern  . None   Social History Narrative  . None   Additional Social History:    Allergies:  No Known Allergies  Labs: No results found for this or any previous visit (from the past 48 hour(s)).  Current Facility-Administered Medications  Medication Dose Route Frequency Provider Last Rate Last Dose  . acetaminophen (TYLENOL) tablet 650 mg  650 mg Oral Q6H PRN Beau Fanny, FNP      . alum & mag hydroxide-simeth (MAALOX/MYLANTA) 200-200-20 MG/5ML suspension 30 mL  30 mL Oral Q4H PRN Beau Fanny, FNP      . buPROPion (WELLBUTRIN XL) 24 hr tablet 300 mg  300 mg Oral Daily Beau Fanny, FNP   300 mg at 01/14/16 0859  . clobetasol (OLUX) 0.05 % foam 1 application  1 application Topical BID Beau Fanny, FNP      . hydrOXYzine (ATARAX/VISTARIL) tablet 25 mg  25 mg Oral Q6H PRN Beau Fanny, FNP      . lithium carbonate capsule 300 mg  300 mg Oral Daily Craige Cotta, MD   300 mg at 01/14/16 0859  . lithium tablet 600 mg  600 mg Oral QHS Beau Fanny, FNP   600 mg at 01/13/16 2157  . loratadine (CLARITIN) tablet 10 mg  10 mg Oral Daily Beau Fanny, FNP   10 mg at 01/14/16 0859  . magnesium hydroxide (MILK OF MAGNESIA) suspension 30 mL  30 mL Oral Daily PRN Beau Fanny, FNP      . mirtazapine (REMERON) tablet 15 mg  15 mg Oral QHS Beau Fanny, FNP   15 mg at 01/13/16 2153  . norgestimate-ethinyl estradiol (ORTHO-CYCLEN,SPRINTEC,PREVIFEM) 0.25-35 MG-MCG tablet 1 tablet  1 tablet Oral QHS Beau Fanny, FNP   1 tablet at 01/13/16 2158  . traZODone (DESYREL) tablet 50 mg  50 mg Oral QHS Beau Fanny, FNP   50 mg at 01/13/16 2153    Musculoskeletal: Strength & Muscle Tone: within normal limits Gait & Station: normal Patient leans: N/A  Psychiatric Specialty Exam: Physical Exam  ROS  Blood pressure (!) 101/46, pulse 78, temperature 98.1 F (36.7 C), resp. rate 16, height 5\' 2"  (1.575 m), weight 65.8 kg (144 lb 15.9 oz), last menstrual period 12/27/2015, SpO2 100 %.Body mass index is 26.52 kg/m.  General Appearance: Casual and Fairly Groomed  Eye Contact:  Good  Speech:  Clear and Coherent and Normal Rate  Volume:  Normal  Mood:  Anxious and Depressed  Affect:  Appropriate, Congruent and Depressed  Thought Process:  Coherent, Goal Directed, Linear and Descriptions of Associations: Intact  Orientation:  Full (Time, Place, and Person)  Thought Content:  Symptoms, worries, concerns  Suicidal Thoughts:  No  Homicidal Thoughts:  No  Memory:  Immediate;   Fair Recent;   Fair Remote;   Fair  Judgement:  Fair  Insight:  Fair  Psychomotor Activity:  Normal  Concentration:  Concentration: Fair and Attention Span: Fair  Recall:  FiservFair  Fund of Knowledge:  Fair  Language:  Fair  Akathisia:  No  Handed:    AIMS (if indicated):     Assets:  Communication Skills Desire for Improvement Resilience Social Support Talents/Skills  ADL's:  Intact  Cognition:  WNL  Sleep:       Treatment Plan Summary: MDD (major depressive disorder), recurrent severe, without psychosis (HCC) stable for outpatient management, [facility]  Disposition:  -Discharge home with outpatient resources  Beau FannyWithrow, John C, FNP 01/14/2016 9:00 AM  Agree with NP assessment as above

## 2016-01-14 NOTE — [facility] Counselor (Signed)
This Clinical research associatewriter made telephone call to [facility] to schedule a follow up with Verlan FriendsSara Young (Therapist). This Clinical research associatewriter was given an appointment date of January 21, 2016 @ 11am . Pt was receptive of this information.

## 2016-01-14 NOTE — Discharge Summary (Signed)
[facility] OBS UNIT DISCHARGE SUMMARY (due to short LOS, pt seen once)  Patient Identification: Katelyn ChurchChrista [name] MRN:  [id number] Principal Diagnosis: MDD (major depressive disorder), recurrent severe, without psychosis (HCC) Diagnosis:   Patient Active Problem List   Diagnosis Date Noted  . MDD (major depressive disorder), recurrent severe, without psychosis (HCC) [F33.2] 01/13/2016    Priority: High  . Bipolar disorder, current episode depressed, severe, without psychotic features (HCC) [F31.4]   . GAD (generalized anxiety disorder) [F41.1] 04/28/2014  . Depressive episode [F32.9] 04/26/2014  . Suicidal ideation [R45.851]   . Bipolar affective disorder (HCC) [F31.9]   . Bipolar 1 disorder with moderate mania (HCC) [F31.12] 02/11/2014  . Bipolar disorder, unspecified (HCC) [F31.9] 06/15/2013  . Polypharmacy [Z79.899] 01/08/2013  . 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 Psoriasis [L40.9] 08/05/2011  . 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 Post traumatic stress disorder (PTSD) [F43.10] 04/10/2011  . Generalized anxiety disorder [F41.1] 04/10/2011    Total Time spent with patient: 45 minutes  Subjective:   Katelyn ChurchChrista [name] is a 45 y.o. female patient admitted with reports of suicidal ideation, generalized, without plan. Pt seen and chart reviewed. Pt is alert/oriented x4, calm, cooperative, and appropriate to situation. Pt denies suicidal/homicidal ideation and psychosis and does not appear to be responding to internal stimuli. Pt reports major improvement in symptoms and has resources for outpatient.   HPI: I have reviewed and concur with HPI elements below, modified as follows: Katelyn ChurchChrista [name] is an 45 y.o. female. Pt reports SI with no plan. Pt reports 3 previous SI attempts. Pt denies HI. Pt denies AVH. Pt reports increased depression. Pt reports the following depressive symptoms: loss of interest in activities, decreased sleep, loss of appetite, and  irritability.  Pt states she receiving medication management and therapy. Pt reports multiple hospitalizations. Pt is prescribed Mirtzapine, Bupropion, Toprimate, Lithium, Alprazolam, and Sprintic. Pt denies SA. Pt denies abuse.   Pt spent the night in OBS, arriving late yesterday. She has been participating without incident.   Past Psychiatric History: MDD  Risk to Self: Suicidal Ideation: Yes-Currently Present Suicidal Intent: No Is patient at risk for suicide?: Yes Suicidal Plan?: No Access to Means: No What has been your use of drugs/alcohol within the last 12 months?: NA How many times?: 0 Other Self Harm Risks: NA Triggers for Past Attempts: None known Intentional Self Injurious Behavior: None Risk to Others: Homicidal Ideation: No Thoughts of Harm to Others: No Current Homicidal Intent: No Current Homicidal Plan: No Access to Homicidal Means: No Identified Victim: NA History of harm to others?: No Assessment of Violence: None Noted Violent Behavior Description: NA Does patient have access to weapons?: No Criminal Charges Pending?: No Does patient have a court date: No Prior Inpatient Therapy: Prior Inpatient Therapy: Yes Prior Therapy Dates: multiplel last 2016 [facility] Reason for Treatment: depression Prior Outpatient Therapy: Prior Outpatient Therapy: Yes Prior Therapy Dates: current Prior Therapy Facilty/Provider(s): Dr. Alene Mireshortakura Reason for Treatment: depression Does patient have an ACCT team?: No Does patient have Intensive In-House Services?  : No Does patient have [facility] services? : No Does patient have P4CC services?: No  Past Medical History:  Past Medical History:  Diagnosis Date  . Anxiety   . Arthritis    soriatic  . Bipolar affective disorder, manic, mild (HCC)   . Depression   . Migraine headache   . PTSD (post-traumatic stress disorder)     Past Surgical History:  Procedure Laterality Date  . CESAREAN SECTION     .  CHOLECYSTECTOMY     Family History:  Family History  Problem Relation Age of Onset  . Depression Father   . Alcohol abuse Sister   . Depression Sister   . Alcohol abuse Maternal Grandfather    Family Psychiatric  History: MDD Social History:  History  Alcohol Use No     History  Drug Use No    Social History   Social History  . Marital status: Married    Spouse name: N/A  . Number of children: N/A  . Years of education: N/A   Social History Main Topics  . Smoking status: Never Smoker  . Smokeless tobacco: Never Used  . Alcohol use No  . Drug use: No  . Sexual activity: Yes    Birth control/ protection: Pill   Other Topics Concern  . None   Social History Narrative  . None   Additional Social History:    Allergies:  No Known Allergies  Labs: No results found for this or any previous visit (from the past 48 hour(s)).  Current Facility-Administered Medications  Medication Dose Route Frequency Provider Last Rate Last Dose  . acetaminophen (TYLENOL) tablet 650 mg  650 mg Oral Q6H PRN Beau Fanny, FNP      . alum & mag hydroxide-simeth (MAALOX/MYLANTA) 200-200-20 MG/5ML suspension 30 mL  30 mL Oral Q4H PRN Beau Fanny, FNP      . buPROPion (WELLBUTRIN XL) 24 hr tablet 300 mg  300 mg Oral Daily Beau Fanny, FNP   300 mg at 01/14/16 0859  . clobetasol (OLUX) 0.05 % foam 1 application  1 application Topical BID Beau Fanny, FNP      . hydrOXYzine (ATARAX/VISTARIL) tablet 25 mg  25 mg Oral Q6H PRN Beau Fanny, FNP      . lithium carbonate capsule 300 mg  300 mg Oral Daily Craige Cotta, MD   300 mg at 01/14/16 0859  . lithium tablet 600 mg  600 mg Oral QHS Beau Fanny, FNP   600 mg at 01/13/16 2157  . loratadine (CLARITIN) tablet 10 mg  10 mg Oral Daily Beau Fanny, FNP   10 mg at 01/14/16 0859  . magnesium hydroxide (MILK OF MAGNESIA) suspension 30 mL  30 mL Oral Daily PRN Beau Fanny, FNP      . mirtazapine (REMERON) tablet 15 mg  15  mg Oral QHS Beau Fanny, FNP   15 mg at 01/13/16 2153  . norgestimate-ethinyl estradiol (ORTHO-CYCLEN,SPRINTEC,PREVIFEM) 0.25-35 MG-MCG tablet 1 tablet  1 tablet Oral QHS Beau Fanny, FNP   1 tablet at 01/13/16 2158  . traZODone (DESYREL) tablet 50 mg  50 mg Oral QHS Beau Fanny, FNP   50 mg at 01/13/16 2153    Musculoskeletal: Strength & Muscle Tone: within normal limits Gait & Station: normal Patient leans: N/A  Psychiatric Specialty Exam: Physical Exam  ROS  Blood pressure (!) 101/46, pulse 78, temperature 98.1 F (36.7 C), resp. rate 16, height 5\' 2"  (1.575 m), weight 65.8 kg (144 lb 15.9 oz), last menstrual period 12/27/2015, SpO2 100 %.Body mass index is 26.52 kg/m.  General Appearance: Casual and Fairly Groomed  Eye Contact:  Good  Speech:  Clear and Coherent and Normal Rate  Volume:  Normal  Mood:  Anxious and Depressed  Affect:  Appropriate, Congruent and Depressed  Thought Process:  Coherent, Goal Directed, Linear and Descriptions of Associations: Intact  Orientation:  Full (Time, Place, and Person)  Thought Content:  Symptoms, worries, concerns  Suicidal Thoughts:  No  Homicidal Thoughts:  No  Memory:  Immediate;   Fair Recent;   Fair Remote;   Fair  Judgement:  Fair  Insight:  Fair  Psychomotor Activity:  Normal  Concentration:  Concentration: Fair and Attention Span: Fair  Recall:  [employer] of Knowledge:  Fair  Language:  Fair  Akathisia:  No  Handed:    AIMS (if indicated):     Assets:  Communication Skills Desire for Improvement Resilience Social Support Talents/Skills  ADL's:  Intact  Cognition:  WNL  Sleep:       Treatment Plan Summary: MDD (major depressive disorder), recurrent severe, without psychosis (HCC) stable for outpatient management, improved during stay in [facility] OBS UNIT  Disposition:  -Discharge home with outpatient resources  Beau Fanny, FNP 01/14/2016 9:00 AM  Agree with NP discharge note as above

## 2016-01-14 NOTE — Progress Notes (Signed)
D: Katelyn [name] & O X4. Denies SI, HI, pain and AVH. Presents with depressed affect and mood on initial contact, brightened up on interactions as the shift progressed. Ambulatory with steady gait. Katelyn discharged from Observation Unit as ordered. Katelyn's car is in the parking lot.  [name]: Emotional support and availability provided to Katelyn. D/C instructions, prescriptions and AVS reviewed with Katelyn at time of d/c. Encouraged to voice concerns and comply with d/c instructions. Scheduled medications administered as prescribed. All belongings from locker 58 returned to Katelyn at time of departure. Safety maintained in Observation unit till time of d/c without self harm gestures or outburst to note at present.  R: Katelyn receptive to care. Compliant with medications as ordered. Denies adverse drug reactions. Verbalized understanding related to d/c instructions. Signed belonging sheet in agreement with items received. No physical distress evident at time of d/c.

## 2016-01-18 ENCOUNTER — Other Ambulatory Visit (HOSPITAL_COMMUNITY): Payer: BC Managed Care – PPO | Attending: Psychiatry | Admitting: Psychiatry

## 2016-01-18 ENCOUNTER — Encounter (HOSPITAL_COMMUNITY): Payer: Self-pay | Admitting: Psychiatry

## 2016-01-18 DIAGNOSIS — F331 Major depressive disorder, recurrent, moderate: Secondary | ICD-10-CM | POA: Diagnosis present

## 2016-01-18 DIAGNOSIS — F3130 Bipolar disorder, current episode depressed, mild or moderate severity, unspecified: Secondary | ICD-10-CM
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
## 2016-01-19 ENCOUNTER — Other Ambulatory Visit (HOSPITAL_COMMUNITY): Payer: BC Managed Care – PPO | Admitting: Psychiatry

## 2016-01-19 DIAGNOSIS — F3132 Bipolar disorder, current episode depressed, moderate: Secondary | ICD-10-CM

## 2016-01-19 DIAGNOSIS — F331 Major depressive disorder, recurrent, moderate: Secondary | ICD-10-CM | POA: Diagnosis not present

## 2016-01-19 NOTE — Progress Notes (Signed)
Psychiatric Initial Adult Assessment   Patient Identification: Katelyn [name] MRN:  [id number] Date of Evaluation:  01/19/2016 Referral Source: Observation bed stay at [facility] Chief Complaint:depression  Visit Diagnosis: Bipolar 1, most recent episode depression, moderate  History of Present Illness:  Ms Heal has been in IOP in the past.  Since her last admission here she had done very well, "the best I've done in years".  However about 2 months ago a manic episode began where she had excessive energy, hypersexual behavior, feeling that she could do these otherwise unacceptable things as normal and spending her time thinking and arranging the sexual connections.  She felt she was beyond rules.  When she could not sleep at all she finally told her provider the whole story and with medication adjustments the mania was brought under control in about 3 weeks.  It was followed by depression, shame, guilt, no energy, not wanting to get out of the bed, loss of interest and pleasure, suicidal thinking which resulted in the overnight stay in the observation bed.  She is no longer suicidal but still depressed.   She has a job she likes.  She had begun unsupervised visits with her son and was optimistic until the manic episode and is hoping she can get herself under control before more damage is done.  Associated Signs/Symptoms: Depression Symptoms:  depressed mood, anhedonia, hypersomnia, fatigue, feelings of worthlessness/guilt, difficulty concentrating, hopelessness, impaired memory, suicidal thoughts without plan, anxiety, (Hypo) Manic Symptoms:  Irritable Mood, Anxiety Symptoms:  Excessive Worry, Psychotic Symptoms:  none PTSD Symptoms: Negative  Past Psychiatric History: multiple psychiatric hospitalizations  Previous Psychotropic Medications: Yes   Substance Abuse History in the last 12 months:  No.  Consequences of Substance Abuse: Negative  Past Medical History:  Past  Medical History:  Diagnosis Date  . Anxiety   . Arthritis    soriatic  . Bipolar affective disorder, manic, mild (HCC)   . Depression   . Migraine headache   . PTSD (post-traumatic stress disorder)     Past Surgical History:  Procedure Laterality Date  . CESAREAN SECTION    . CHOLECYSTECTOMY      Family Psychiatric History: no changes  Family History:  Family History  Problem Relation Age of Onset  . Depression Father   . Alcohol abuse Father   . Alcohol abuse Sister   . Depression Sister   . Alcohol abuse Maternal Grandfather     Social History:   Social History   Social History  . Marital status: Married    Spouse name: N/A  . Number of children: N/A  . Years of education: N/A   Social History Main Topics  . Smoking status: Never Smoker  . Smokeless tobacco: Never Used  . Alcohol use No  . Drug use: No  . Sexual activity: Yes    Birth control/ protection: Pill   Other Topics Concern  . Not on file   Social History Narrative  . No narrative on file    Additional Social History: no changes  Allergies:  No Known Allergies  Metabolic Disorder Labs: No results found for: HGBA1C, MPG No results found for: PROLACTIN Lab Results  Component Value Date   CHOL 240 (H) 02/11/2014   TRIG 190 (H) 02/11/2014   HDL 42 02/11/2014   CHOLHDL 5.7 02/11/2014   VLDL 38 02/11/2014   LDLCALC 160 (H) 02/11/2014     Current Medications: Current Outpatient Prescriptions  Medication Sig Dispense Refill  . ALPRAZolam Prudy Feeler  XR) 1 MG 24 hr tablet Take 1 mg by mouth every morning.    Marland Kitchen. buPROPion (WELLBUTRIN XL) 300 MG 24 hr tablet Take 300 mg by mouth daily.    . cetirizine (ZYRTEC) 10 MG tablet Take 10 mg by mouth daily as needed for allergies.    . clobetasol (OLUX) 0.05 % topical foam Apply 1 application topically 2 (two) times daily.  2  . etanercept (ENBREL) 50 MG/ML injection Inject 0.98 mLs (50 mg total) into the skin every Sunday. For Psoraisis (Patient taking  differently: Inject 50 mg into the skin 2 (two) times a week. For Psoraisis-- On Saturday and Tuesday.) 0.98 mL   . hydrOXYzine (ATARAX/VISTARIL) 25 MG tablet Take 1 tablet (25 mg total) by mouth every 6 (six) hours as needed for anxiety. 30 tablet 0  . lithium 300 MG tablet Take 300-600 mg by mouth 2 (two) times daily. 300 mg in the morning and 600 mg at bedtime    . mirtazapine (REMERON) 15 MG tablet Take 15 mg by mouth at bedtime.  1  . norgestimate-ethinyl estradiol (ORTHO-CYCLEN,SPRINTEC,PREVIFEM) 0.25-35 MG-MCG tablet Take 1 tablet by mouth at bedtime. For Birth control method 1 Package 0  . traZODone (DESYREL) 50 MG tablet Take 1 tablet (50 mg total) by mouth at bedtime. For sleep 14 tablet 0   No current facility-administered medications for this visit.     Neurologic: Headache: Negative Seizure: Negative Paresthesias:Negative  Musculoskeletal: Strength & Muscle Tone: within normal limits Gait & Station: normal Patient leans: N/A  Psychiatric Specialty Exam: ROS  Last menstrual period 12/27/2015.There is no height or weight on file to calculate BMI.  General Appearance: Well Groomed  Eye Contact:  Good  Speech:  Clear and Coherent  Volume:  Normal  Mood:  Depressed  Affect:  Congruent  Thought Process:  Coherent  Orientation:  Full (Time, Place, and Person)  Thought Content:  Logical  Suicidal Thoughts:  No  Homicidal Thoughts:  No  Memory:  Immediate;   Good Recent;   Good Remote;   Good  Judgement:  Intact  Insight:  Good  Psychomotor Activity:  Normal  Concentration:  Concentration: Good and Attention Span: Good  Recall:  Good  Fund of Knowledge:Good  Language: Good  Akathisia:  Negative  Handed:  Right  AIMS (if indicated):  0  Assets:  Communication Skills Desire for Improvement Financial Resources/Insurance Housing Physical Health Resilience Social Support Talents/Skills Transportation Vocational/Educational  ADL's:  Intact  Cognition: WNL   Sleep:  Too much    Treatment Plan Summary: Admit to IOP for daily group therapy.  Continue current meds   Carolanne GrumblingGerald Taylor, MD 9/26/201712:27 PM

## 2016-01-20 ENCOUNTER — Other Ambulatory Visit (HOSPITAL_COMMUNITY): Payer: BC Managed Care – PPO

## 2016-01-20 NOTE — Progress Notes (Signed)
    Daily Group Progress Note  Program: IOP Group Time: 9:00-12:00   Participation Level:  minimal    Behavioral Response:  responsive   Type of Therapy:  group therapy   Summary of Progress: This was the clients first day in group, she was with the social worker, and saw the psychiatrist. She spoke minimally, but shared that she was in the group on account of bipolar disorder. Client reported struggling with a manic episode during the summer, in which she did a number of things she felt very ashamed of. At present she is very depressed. This relapse into her disorder was/is a disappointment to her because in the past year she'd felt a sense of relative stability. During the drawing exercise client drew a woman screaming out a number of painful phrases. She explained that she feels silenced in her depression, and as if she just wants to scream, but cannot.   Shaune PollackBrown, Julianna Vanwagner B, LPC

## 2016-01-20 NOTE — Progress Notes (Signed)
    Daily Group Progress Note  Program: IOP  Group Time: 9:00-10:30  Participation Level: Active  Behavioral Response: Appropriate  Type of Therapy:  Group Therapy  Summary of Progress: Pt. Continues to present with primarily flat affect, reports that her depression is very bad. Pt. Makes intermittent eye contact and is responsive to questions from the counselor. Pt. Discussed her relationship with her son and decision that she and her ex-husband made for her son to live his father while she is receiving treatment. Pt. Discussed her desire to provide age appropriate explanations of her mental health diagnosis to her son and maintain the trust that she has with him. Pt. Discussed ongoing challenges of trying to be a good parent while managing her mental illness.      Shaune PollackBrown, Jennifer B, LPC

## 2016-01-21 ENCOUNTER — Other Ambulatory Visit (HOSPITAL_COMMUNITY): Payer: BC Managed Care – PPO | Admitting: Psychiatry

## 2016-01-21 ENCOUNTER — Telehealth (HOSPITAL_COMMUNITY): Payer: Self-pay | Admitting: Psychiatry

## 2016-01-21 NOTE — Telephone Encounter (Signed)
D:  Placed call to pt to inform her that Dr. Ladona Ridgelaylor said she could increase the Xanax XR to 2 mg each morning; instead of 1 mg.  Pt mentioned that today was a better day than yesterday.  She saw Maralyn SagoSarah (therapist) today.  Reports that Maralyn SagoSarah is referring her to [facility] where their specialty is Bipolar D/O.  "I'm a little sad because Maralyn SagoSarah has been very good to me, but I need to do what's best for my health.  A:  Inform Dr. Ladona Ridgelaylor.  R:  Pt receptive.

## 2016-01-22 ENCOUNTER — Other Ambulatory Visit (HOSPITAL_COMMUNITY): Payer: BC Managed Care – PPO | Admitting: Psychiatry

## 2016-01-22 DIAGNOSIS — F3132 Bipolar disorder, current episode depressed, moderate: Secondary | ICD-10-CM

## 2016-01-22 DIAGNOSIS — F331 Major depressive disorder, recurrent, moderate: Secondary | ICD-10-CM | POA: Diagnosis not present

## 2016-01-25 ENCOUNTER — Other Ambulatory Visit (HOSPITAL_COMMUNITY): Payer: BC Managed Care – PPO | Attending: Psychiatry | Admitting: Psychiatry

## 2016-01-25 DIAGNOSIS — F331 Major depressive disorder, recurrent, moderate: Secondary | ICD-10-CM | POA: Insufficient documentation

## 2016-01-25 DIAGNOSIS — F3132 Bipolar disorder, current episode depressed, moderate: Secondary | ICD-10-CM

## 2016-01-26 ENCOUNTER — Other Ambulatory Visit (HOSPITAL_COMMUNITY): Payer: BC Managed Care – PPO | Admitting: Psychiatry

## 2016-01-26 DIAGNOSIS — F3132 Bipolar disorder, current episode depressed, moderate: Secondary | ICD-10-CM

## 2016-01-26 DIAGNOSIS — F331 Major depressive disorder, recurrent, moderate: Secondary | ICD-10-CM | POA: Diagnosis not present

## 2016-01-26 NOTE — Progress Notes (Signed)
    Daily Group Progress Note  Program: IOP  Group Time: 9:00-12:00   Participation Level:  Active   Behavioral Response:  engaged, responsive   Type of Therapy:   group therapy   Summary of Progress:  Client reports being referred by her current therapist to a bipolar specialist. She expressed that depression and pain are her normative state. However, she bumped into a friend at a coffee shop the day before, and this interaction had a very positive effect on her. This friend was a member of a support group the client formerly attended, and her friend encouraged her to go to the group that night. She did, and reported feeling a lot better about doing so.   Shaune PollackBrown, Dyanne Yorks B, LPC

## 2016-01-27 ENCOUNTER — Other Ambulatory Visit (HOSPITAL_COMMUNITY): Payer: BC Managed Care – PPO | Admitting: Psychiatry

## 2016-01-27 DIAGNOSIS — F331 Major depressive disorder, recurrent, moderate: Secondary | ICD-10-CM | POA: Diagnosis not present

## 2016-01-27 DIAGNOSIS — F3132 Bipolar disorder, current episode depressed, moderate: Secondary | ICD-10-CM

## 2016-01-27 NOTE — Progress Notes (Signed)
    Daily Group Progress Note  Program: IOP  Group Time: 9:00-12:00   Participation Level: Active   Behavioral Response: engaged, responsive   Type of Therapy: group therapy   Summary of Progress:  Pt. Participated in medication management group with the pharmacist during the first half of group. Client reports missing her son, due to his now being in the sole custody of his father, her ex husband. Client agreed with her ex that this was best for their son, since she is currently feeling unstable. She is working to accept her grief for her loss of custody, as well as her grief over her disorder. Client feels deeply frustrated that she may never "be normal". Client expressed fears that her son will be emotionally damaged by her absence.   Shaune PollackBrown, Jennifer B, LPC

## 2016-01-27 NOTE — Telephone Encounter (Signed)
D:  According to group leader, pt left group early and didn't return.  Placed call to pt to check on her.  According to pt, she left group due to forgetting to take her medications.  Also, states she felt a little more weepy and sad today and didn't want to start crying in group.  "I felt that if I were to start that I wouldn't be able to stop."  A:  Provided pt with support.  Reassured pt that MH-IOP is a safe place to cry.  Encouraged pt to go for a walk before dark.  Pt denies SI/HI or A/V hallucinations.  Informed Dr. Ladona Ridgelaylor.  R:  Pt receptive.

## 2016-01-28 ENCOUNTER — Other Ambulatory Visit (HOSPITAL_COMMUNITY): Payer: BC Managed Care – PPO | Admitting: Psychiatry

## 2016-01-28 DIAGNOSIS — F3132 Bipolar disorder, current episode depressed, moderate: Secondary | ICD-10-CM

## 2016-01-28 DIAGNOSIS — F331 Major depressive disorder, recurrent, moderate: Secondary | ICD-10-CM | POA: Diagnosis not present

## 2016-01-28 NOTE — Progress Notes (Signed)
    Daily Group Progress Note  Program: IOP  Group Time: 9:00-12:00  Participation Level: Active  Behavioral Response: Appropriate  Type of Therapy:  Group Therapy  Summary of Progress: Pt. Continues to present as depressed, but responsive to group process and feedback from the counselor and group members. Pt. Continues to feel anxiety about return to work and worries about how her depression will affect her work. Pt. Was challenged to show up authentically at work and worry less about showing up based on other people's expectations of her.      Shaune PollackBrown, Destine Zirkle B, LPC

## 2016-01-28 NOTE — Progress Notes (Signed)
    Daily Group Progress Note  Program: IOP Group Time: 9:00-12:00   Participation Level: Active   Behavioral Response:  engaged, responsive   Type of Therapy:  group therapy   Summary of Progress:  Client reports getting rid of the kitten she had briefly fostered. It was tearing up her house, and was causing her a lot of anxiety and distress. She feels better now that it is gone. She also reported feeling unready to return to work. However, she runs out of sick days at the beginning of next week. She has a few extra days, but is hoping to keep those for when she is physically ill during the school year (she is a reading teacher in an elementary school).   Shaune PollackBrown, Renada Cronin B, LPC

## 2016-01-29 ENCOUNTER — Other Ambulatory Visit (HOSPITAL_COMMUNITY): Payer: BC Managed Care – PPO

## 2016-01-29 NOTE — Progress Notes (Signed)
    Daily Group Progress Note  Program: IOP Group Time: 9:00-12:00   Participation Level:  minimal    Behavioral Response: responsive   Type of Therapy:   group therapy   Summary of Progress:  Client reported feeling very down after going to the doctor the previous day. She realized she'd gained back 20 lbs after having lost a significant amount of weight. Client expressed that she fears she will inevitably gain back all of the weight she'd lost before. This is in part because she lost the weight due to a medication she was taking, not through any changes in diet or exercise. Client reported feeling extremely depressed. Later in the group the client got up and abruptly left the session without notifying the counselor or the social worker.   Shaune PollackBrown, Enis Leatherwood B, LPC

## 2016-02-01 ENCOUNTER — Other Ambulatory Visit (HOSPITAL_COMMUNITY): Payer: BC Managed Care – PPO | Admitting: Psychiatry

## 2016-02-01 DIAGNOSIS — F331 Major depressive disorder, recurrent, moderate: Secondary | ICD-10-CM | POA: Diagnosis not present

## 2016-02-01 DIAGNOSIS — F3132 Bipolar disorder, current episode depressed, moderate: Secondary | ICD-10-CM

## 2016-02-02 ENCOUNTER — Other Ambulatory Visit (HOSPITAL_COMMUNITY): Payer: BC Managed Care – PPO

## 2016-02-03 ENCOUNTER — Other Ambulatory Visit (HOSPITAL_COMMUNITY): Payer: BC Managed Care – PPO

## 2016-02-04 ENCOUNTER — Other Ambulatory Visit (HOSPITAL_COMMUNITY): Payer: BC Managed Care – PPO | Admitting: Psychiatry

## 2016-02-04 DIAGNOSIS — F3132 Bipolar disorder, current episode depressed, moderate: Secondary | ICD-10-CM

## 2016-02-04 DIAGNOSIS — F331 Major depressive disorder, recurrent, moderate: Secondary | ICD-10-CM | POA: Diagnosis not present

## 2016-02-04 NOTE — Progress Notes (Signed)
    Daily Group Progress Note  Program: IOP Group Time: 9:00-12:00   Participation Level:  active   Behavioral Response: responsive   Type of Therapy:  group therapy   Summary of Progress:  Pt. Participated in medication management group with the pharmacist for the first half of group. Client reported having a difficult weekend. Her depression has been very intense, so to the point that she did not shower for five days. She had a difficult conversation with her son over the weekend, where her son confronted her about his disappointment and feelings of ostracism for having a mother who has bipolar. Client responded evenly and helped her son to positively reframe his circumstances, however the emotional toll of this interaction was intense for the client.   Shaune PollackBrown, Jennifer B, LPC

## 2016-02-05 ENCOUNTER — Other Ambulatory Visit (HOSPITAL_COMMUNITY): Payer: BC Managed Care – PPO | Admitting: Psychiatry

## 2016-02-05 DIAGNOSIS — F331 Major depressive disorder, recurrent, moderate: Secondary | ICD-10-CM | POA: Diagnosis not present

## 2016-02-05 DIAGNOSIS — F3132 Bipolar disorder, current episode depressed, moderate: Secondary | ICD-10-CM

## 2016-02-05 NOTE — Patient Instructions (Signed)
Patient completed MH-IOP today.  Will follow up with Dr. Robet Leuhotakura on 02-09-16 @ 4:30 pm and Lars Massonhase Salmons, LPC-A on 02-13-16 @ 5 pm.  Return to work on 02-08-16; without any restrictions.  Recommended support groups and [facility] every Tuesday 5-6 pm with Idalia NeedleBeth MacKenzie, LCAS.  Call before attending [phone].

## 2016-02-05 NOTE — Progress Notes (Signed)
Katelyn [name] is a 45 y.o., divorced, employed, Caucasian female, who was referred by the OBS Unit.  Pt was admitted there d/t worsening depressive symptoms with SI (plan to wreck car or OD).  Discussed safety options with pt.  Pt continued to struggle with SI (denies a current plan).  Pt was able to contract for safety.  Pt is well known to Probation officer; due to previous MH-IOP admissions on 07-01-13 and 10-06-14.  Stated she had a manic episode June-August.  Reported her psychiatrist (Dr. Abbey Chatters) kept adjusting her meds, but nothing seemed to work.  "After the manic episode, I crashed real bad and that's when I started having severe suicidal thoughts.  The thoughts been building for weeks and that's when I knew I needed help."  Pt reported that the thoughts aren't as bad and frequent now.  Denies A/V hallucinations.  Admits to two previous suicide attempts (cutting wrists). Four previous psychiatric admissions (once at [facility] and three at [facility]). Stated she has been in therapy for 19 years. Has seen Criss Alvine, [facility] for a little over two yrs and Dr. Abbey Chatters for seventeen years. Family Hx: Depression (sister and father) ETOH issues (sister, niece, maternal grandfather).  Stressors include:  Financial Strain:  During manic episode pt spent a lot of money on "trivial things."  Also, c/o medical bills adding up.  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  Reports missing a lot of days d/t depression and anxiety.  Pt expressing some distorted thinking.  "I fear that my coworkers and the principal will discover that I am over my head and can't do the job."  States these thoughts are creating a lot of anxiety for her.  3)  Unresolved grief/loss issues:  "I haven't gotten over losing custody of my son."  C/O of loneliness.  Last admission, pt's sister was residing with her.  According to pt, her sister has gotten a job and has moved out; therefore leaving pt alone. Pt is requesting discharge today due to not  having any more days to be able to take off at work.  Pt reports feeling anxious about returning, but states she is definitely feeling better than when she started MH-IOP.  Pt's affect is brighter.  Reports improved concentration.  Pt met some friends for coffee the other day.  "I feel like the anti-depressant has kicked in.."   Denies SI/HI or A/V hallucinations.  A:  D/C today.  F/U with Dr. Abbey Chatters on 02-09-16 @ 4:30 pm and Judeth Porch, LPC-A on 02-13-16 @ 5 pm.  Encouraged support groups.  Recommended [facility] with Binnie Rail, LCAS every Tuesday 5-6 pm.  R:  Pt receptive.     Carlis Abbott, RITA, M.Ed, CNA

## 2016-02-05 NOTE — Progress Notes (Signed)
    Daily Group Progress Note  Program: IOP  Group Time: 9:00-12:00  Participation Level: Active  Behavioral Response: Appropriate  Type of Therapy:  Group Therapy  Summary of Progress: Pt. Reported that she was feeling very good and attributed change in mood to new anti-depressant and change of therapist that specializes in bipolar disorder. Pt. Reported that her eyes felt more alert, open, and more energy in her gait. Pt. Reported that yesterday she was able to have coffee and laugh with a friend for the first time in weeks. Pt. Participated in discussion about developing a wellness plan and exercising forgiveness of self during periods of crisis.    Shaune PollackBrown, Jennifer B, LPC

## 2016-02-08 ENCOUNTER — Other Ambulatory Visit (HOSPITAL_COMMUNITY): Payer: BC Managed Care – PPO

## 2016-02-09 ENCOUNTER — Other Ambulatory Visit (HOSPITAL_COMMUNITY): Payer: BC Managed Care – PPO

## 2016-02-09 NOTE — Progress Notes (Signed)
Patient ID: Jake ChurchChrista Ege, female   DOB: 11/02/1970, 45 y.o.   MRN: [id number] IOP Discharge  Date of admission: 01/18/2016 Date if discharge:  02/05/2016  IOP Course:  Attended and participated.  She improved considerably by the time of discharge and wanted to return to work so she requested discharge.  No suicidal ideation at the time of her discharge reportedly.  Plan is to return to her outpatient therapist and psychiatrist.

## 2016-02-10 ENCOUNTER — Other Ambulatory Visit (HOSPITAL_COMMUNITY): Payer: BC Managed Care – PPO

## 2016-02-10 NOTE — Progress Notes (Signed)
    Daily Group Progress Note  Program: IOP  Group Time: 9:00-12:00   Participation Level:  active    Behavioral Response:  engaged   Type of Therapy:   group therapy   Summary of Progress:  This was the patient's discharge day. 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 However she reported having two good days in a row, and that is a win for her. She discussed her son, and her sadness over having lost custody of him two years ago. Patient feels humiliated by the need for supervised visits, and wishes she could have him back, though she fears she may never regain custody. She feels a lot of grief around his loss, but she hopes to move through it.   Shaune PollackBrown, Jennifer B, LPC

## 2016-02-11 ENCOUNTER — Other Ambulatory Visit (HOSPITAL_COMMUNITY): Payer: BC Managed Care – PPO

## 2016-02-12 ENCOUNTER — Other Ambulatory Visit (HOSPITAL_COMMUNITY): Payer: BC Managed Care – PPO

## 2016-02-15 ENCOUNTER — Other Ambulatory Visit (HOSPITAL_COMMUNITY): Payer: BC Managed Care – PPO

## 2016-02-16 ENCOUNTER — Other Ambulatory Visit (HOSPITAL_COMMUNITY): Payer: BC Managed Care – PPO

## 2016-02-17 ENCOUNTER — Other Ambulatory Visit (HOSPITAL_COMMUNITY): Payer: BC Managed Care – PPO

## 2016-02-18 ENCOUNTER — Other Ambulatory Visit (HOSPITAL_COMMUNITY): Payer: BC Managed Care – PPO

## 2016-02-19 ENCOUNTER — Other Ambulatory Visit (HOSPITAL_COMMUNITY): Payer: BC Managed Care – PPO

## 2016-02-22 ENCOUNTER — Other Ambulatory Visit (HOSPITAL_COMMUNITY): Payer: BC Managed Care – PPO

## 2016-02-23 ENCOUNTER — Other Ambulatory Visit (HOSPITAL_COMMUNITY): Payer: BC Managed Care – PPO

## 2016-02-24 ENCOUNTER — Other Ambulatory Visit (HOSPITAL_COMMUNITY): Payer: BC Managed Care – PPO

## 2016-02-25 ENCOUNTER — Other Ambulatory Visit (HOSPITAL_COMMUNITY): Payer: BC Managed Care – PPO

## 2016-02-26 ENCOUNTER — Other Ambulatory Visit (HOSPITAL_COMMUNITY): Payer: BC Managed Care – PPO

## 2016-02-29 ENCOUNTER — Emergency Department (HOSPITAL_COMMUNITY)
Admission: EM | Admit: 2016-02-29 | Discharge: 2016-03-01 | Disposition: A | Discharge status: Home / Self Care | Payer: BC Managed Care – PPO | Attending: Emergency Medicine | Admitting: Emergency Medicine

## 2016-02-29 ENCOUNTER — Other Ambulatory Visit (HOSPITAL_COMMUNITY): Payer: BC Managed Care – PPO

## 2016-02-29 ENCOUNTER — Encounter (HOSPITAL_COMMUNITY): Payer: Self-pay | Admitting: Family Medicine

## 2016-02-29 DIAGNOSIS — S39012A Strain of muscle, fascia and tendon of lower back, initial encounter: Secondary | ICD-10-CM | POA: Diagnosis not present

## 2016-02-29 DIAGNOSIS — Y939 Activity, unspecified: Secondary | ICD-10-CM | POA: Diagnosis not present

## 2016-02-29 DIAGNOSIS — Y999 Unspecified external cause status: Secondary | ICD-10-CM | POA: Insufficient documentation

## 2016-02-29 DIAGNOSIS — Y929 Unspecified place or not applicable: Secondary | ICD-10-CM | POA: Insufficient documentation

## 2016-02-29 DIAGNOSIS — X501XXA Overexertion from prolonged static or awkward postures, initial encounter: Secondary | ICD-10-CM | POA: Diagnosis not present

## 2016-02-29 DIAGNOSIS — S3992XA Unspecified injury of lower back, initial encounter: Secondary | ICD-10-CM | POA: Diagnosis present

## 2016-02-29 MED ORDER — DEXAMETHASONE SODIUM PHOSPHATE 10 MG/ML IJ SOLN
10.0000 mg | Freq: Once | INTRAMUSCULAR | Status: AC
  Administered 2016-03-01: 10 mg via INTRAMUSCULAR
  Filled 2016-02-29: qty 1

## 2016-02-29 MED ORDER — KETOROLAC TROMETHAMINE 60 MG/2ML IM SOLN
60.0000 mg | Freq: Once | INTRAMUSCULAR | Status: AC
  Administered 2016-03-01: 60 mg via INTRAMUSCULAR
  Filled 2016-02-29: qty 2

## 2016-02-29 NOTE — ED Provider Notes (Signed)
[facility]-EMERGENCY DEPT Provider Note   CSN: [id number] Arrival date & time: 02/29/16  2035     History   Chief Complaint Chief Complaint  Patient presents with  . Back Pain    HPI Katelyn [name] is a 45 y.o. female.   Katelyn [name] is a 45 y.o. female who complains of an injury causing low back pain 5 hour(s) ago. The pain is positional with bending or lifting, without radiation down the legs. Mechanism of injury: Patient was leaing over to pick up a water bottle and had severe spasm upon trying to stand back up. Symptoms have been constant since that time. Prior history of back problems: recurrent self limited episodes of low back pain in the past. Denies weakness, loss of bowel/bladder function or saddle anesthesia in the legs. Denies leg weakness, saddle anesthesia, urinary sxs.     Back Pain   This is a recurrent problem. The current episode started 3 to 5 hours ago. The problem occurs constantly. The problem has not changed since onset.The pain is associated with twisting. The pain is present in the lumbar spine. The quality of the pain is described as stabbing. The pain does not radiate. The pain is at a severity of 10/10. The pain is severe. The symptoms are aggravated by bending, twisting and certain positions. Pertinent negatives include no chest pain, no fever, no numbness, no weight loss, no headaches, no abdominal pain, no abdominal swelling, no bowel incontinence, no perianal numbness, no bladder incontinence, no dysuria, no pelvic pain, no leg pain, no paresthesias, no paresis, no tingling and no weakness. She has tried nothing for the symptoms. Risk factors include lack of exercise.    Past Medical History:  Diagnosis Date  . Anxiety   . Arthritis    soriatic  . Bipolar affective disorder, manic, mild (HCC)   . Depression   . Migraine headache   . PTSD (post-traumatic stress disorder)     Patient Active Problem List   Diagnosis Date Noted  . MDD (major  depressive disorder), recurrent severe, without psychosis (HCC) 01/13/2016  . Bipolar disorder, current episode depressed, severe, without psychotic features (HCC)   . GAD (generalized anxiety disorder) 04/28/2014  . Depressive episode 04/26/2014  . Suicidal ideation   . Bipolar affective disorder (HCC)   . Bipolar 1 disorder with moderate mania (HCC) 02/11/2014  . Bipolar disorder, unspecified 06/15/2013  . Polypharmacy 01/08/2013  . Psoriatic arthropathy (HCC) 01/24/2012  . Psoriasis 08/05/2011  . 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 Post traumatic stress disorder (PTSD) 04/10/2011  . Generalized anxiety disorder 04/10/2011    Past Surgical History:  Procedure Laterality Date  . CESAREAN SECTION    . CHOLECYSTECTOMY      OB History    No data available       Home Medications    Prior to Admission medications   Medication Sig Start Date End Date Taking? Authorizing Provider  ALPRAZolam (XANAX XR) 1 MG 24 hr tablet Take 1 mg by mouth every morning. May take 2 mg in the morning as needed    Historical Provider, MD  buPROPion (WELLBUTRIN XL) 300 MG 24 hr tablet Take 300 mg by mouth daily.    Historical Provider, MD  cetirizine (ZYRTEC) 10 MG tablet Take 10 mg by mouth daily as needed for allergies.    Historical Provider, MD  clobetasol (OLUX) 0.05 % topical foam Apply 1 application topically 2 (two) times daily. 12/22/15   Historical Provider, MD  etanercept (ENBREL) 50  MG/ML injection Inject 0.98 mLs (50 mg total) into the skin every Sunday. For Psoraisis Patient taking differently: Inject 50 mg into the skin 2 (two) times a week. For Psoraisis-- On Saturday and Tuesday. 05/02/14   Sanjuana KavaAgnes I Nwoko, NP  hydrOXYzine (ATARAX/VISTARIL) 25 MG tablet Take 1 tablet (25 mg total) by mouth every 6 (six) hours as needed for anxiety. 01/14/16   Beau FannyJohn C Withrow, FNP  lithium 300 MG tablet Take 300-600 mg by mouth 2 (two) times daily. 300 mg in the morning and 600 mg at bedtime 01/02/16    Historical Provider, MD  mirtazapine (REMERON) 15 MG tablet Take 15 mg by mouth at bedtime. 12/10/15   Historical Provider, MD  norgestimate-ethinyl estradiol (ORTHO-CYCLEN,SPRINTEC,PREVIFEM) 0.25-35 MG-MCG tablet Take 1 tablet by mouth at bedtime. For Birth control method 01/14/16   Beau FannyJohn C Withrow, FNP  traZODone (DESYREL) 50 MG tablet Take 1 tablet (50 mg total) by mouth at bedtime. For sleep 01/14/16   Beau FannyJohn C Withrow, FNP    Family History Family History  Problem Relation Age of Onset  . Depression Father   . Alcohol abuse Father   . Alcohol abuse Sister   . Depression Sister   . Alcohol abuse Maternal Grandfather     Social History Social History  Substance Use Topics  . Smoking status: Never Smoker  . Smokeless tobacco: Never Used  . Alcohol use No     Allergies   Patient has no known allergies.   Review of Systems Review of Systems  Constitutional: Negative for fever and weight loss.  Cardiovascular: Negative for chest pain.  Gastrointestinal: Negative for abdominal pain and bowel incontinence.  Genitourinary: Negative for bladder incontinence, dysuria and pelvic pain.  Musculoskeletal: Positive for back pain.  Neurological: Negative for tingling, weakness, numbness, headaches and paresthesias.     Physical Exam Updated Vital Signs BP 123/56 (BP Location: Left Arm)   Pulse 65   Temp 98.9 F (37.2 C) (Oral)   Resp 18   Ht 5\' 2"  (1.575 m)   Wt 70.3 kg   SpO2 100%   BMI 28.35 kg/m   Physical Exam  Constitutional: She is oriented to person, place, and time. She appears well-developed and well-nourished. No distress.  HENT:  Head: Normocephalic and atraumatic.  Eyes: Conjunctivae are normal. No scleral icterus.  Neck: Normal range of motion.  Cardiovascular: Normal rate, regular rhythm and normal heart sounds.  Exam reveals no gallop and no friction rub.   No murmur heard. Pulmonary/Chest: Effort normal and breath sounds normal. No respiratory distress.    Abdominal: Soft. Bowel sounds are normal. She exhibits no distension and no mass. There is no tenderness. There is no guarding.  Musculoskeletal:  Patient appears to be in mild to moderate pain, antalgic gait noted. Lumbosacral spine area reveals no local tenderness or mass. Painful and reduced LS ROM noted. Straight leg raise is negative  DTR's, motor strength and sensation normal, including heel and toe gait.  Peripheral pulses are palpable. Negative cva tenderness  Neurological: She is alert and oriented to person, place, and time.  Skin: Skin is warm and dry. She is not diaphoretic.     ED Treatments / Results  Labs (all labs ordered are listed, but only abnormal results are displayed) Labs Reviewed - No data to display  EKG  EKG Interpretation None       Radiology No results found.  Procedures Procedures (including critical care time)  Medications Ordered in ED Medications  dexamethasone (DECADRON)  injection 10 mg (not administered)  ketorolac (TORADOL) injection 60 mg (not administered)     Initial Impression / Assessment and Plan / ED Course  I have reviewed the triage vital signs and the nursing notes.  Pertinent labs & imaging results that were available during my care of the patient were reviewed by me and considered in my medical decision making (see chart for details).  Clinical Course     Patient with back pain.  No neurological deficits and normal neuro exam.  Patient can walk but states is painful.  No loss of bowel or bladder control.  No concern for cauda equina.  No fever, night sweats, weight loss, h/o cancer, IVDU.  RICE protocol and pain medicine indicated and discussed with patient.    Final Clinical Impressions(s) / ED Diagnoses   Final diagnoses:  None    New Prescriptions New Prescriptions   No medications on file     Arthor Captainbigail Karianne Nogueira, PA-C 03/01/16 0017    Pricilla LovelessScott Goldston, MD 03/02/16 1722

## 2016-02-29 NOTE — ED Triage Notes (Signed)
Patient states she was bending over to get a water bottle when she started experiencing lower back pain. The pain was bad enough that she had to call a friend to come get her off the floor. Denies any numbness, tingling, and loss of bowel or bladder. Pt is able to sit still in the wheelchair. Pt denies taking any pain medication, states "it never even occurred to me to take Ibuprofen".

## 2016-03-01 ENCOUNTER — Other Ambulatory Visit (HOSPITAL_COMMUNITY): Payer: BC Managed Care – PPO

## 2016-03-01 MED ORDER — NAPROXEN 500 MG PO TABS: 500.0000 mg | ORAL_TABLET | Freq: Two times a day (BID) | ORAL | 0 refills | Status: DC

## 2016-03-01 MED ORDER — CYCLOBENZAPRINE HCL 10 MG PO TABS: 10.0000 mg | ORAL_TABLET | Freq: Two times a day (BID) | ORAL | 0 refills | Status: DC | PRN

## 2016-03-01 MED ORDER — HYDROCODONE-ACETAMINOPHEN 5-325 MG PO TABS: 1.0000 | ORAL_TABLET | Freq: Four times a day (QID) | ORAL | 0 refills | Status: DC | PRN

## 2016-03-01 NOTE — Discharge Instructions (Signed)
I recommend Lidocaine 4% patches ( brands: Salon Pas or Icy hot)  SEEK IMMEDIATE MEDICAL ATTENTION IF: New numbness, tingling, weakness, or problem with the use of your arms or legs.  Severe back pain not relieved with medications.  Change in bowel or bladder control.  Increasing pain in any areas of the body (such as chest or abdominal pain).  Shortness of breath, dizziness or fainting.  Nausea (feeling sick to your stomach), vomiting, fever, or sweats.

## 2016-03-02 ENCOUNTER — Other Ambulatory Visit (HOSPITAL_COMMUNITY): Payer: BC Managed Care – PPO

## 2016-03-03 ENCOUNTER — Other Ambulatory Visit (HOSPITAL_COMMUNITY): Payer: BC Managed Care – PPO

## 2016-03-04 ENCOUNTER — Other Ambulatory Visit (HOSPITAL_COMMUNITY): Payer: BC Managed Care – PPO

## 2016-03-07 ENCOUNTER — Other Ambulatory Visit (HOSPITAL_COMMUNITY): Payer: BC Managed Care – PPO

## 2016-03-08 ENCOUNTER — Other Ambulatory Visit (HOSPITAL_COMMUNITY): Payer: BC Managed Care – PPO

## 2016-03-09 ENCOUNTER — Other Ambulatory Visit (HOSPITAL_COMMUNITY): Payer: BC Managed Care – PPO

## 2016-03-10 ENCOUNTER — Other Ambulatory Visit (HOSPITAL_COMMUNITY): Payer: BC Managed Care – PPO

## 2016-03-11 ENCOUNTER — Other Ambulatory Visit (HOSPITAL_COMMUNITY): Payer: BC Managed Care – PPO

## 2016-05-10 ENCOUNTER — Ambulatory Visit (INDEPENDENT_AMBULATORY_CARE_PROVIDER_SITE_OTHER): Payer: BC Managed Care – PPO | Admitting: Neurology

## 2016-05-10 ENCOUNTER — Encounter: Payer: Self-pay | Admitting: Neurology

## 2016-05-10 VITALS — BP 100/62 | HR 66 | Ht 62.0 in | Wt 143.0 lb

## 2016-05-10 DIAGNOSIS — R2681 Unsteadiness on feet: Secondary | ICD-10-CM | POA: Diagnosis not present

## 2016-05-10 DIAGNOSIS — G2119 Other drug induced secondary parkinsonism: Secondary | ICD-10-CM

## 2016-05-10 DIAGNOSIS — H539 Unspecified visual disturbance: Secondary | ICD-10-CM | POA: Diagnosis not present

## 2016-05-10 NOTE — Progress Notes (Signed)
NEUROLOGY CONSULTATION NOTE  Collie Kittel MRN: [id number] DOB: [birth year]  Referring provider: Helane Gunther, PA-C Primary care provider: Jamie Kato, PA-C  Reason for consult:  Vision and gait changes  Thank you for your kind referral of Katelyn [name] for consultation of the above symptoms. Although her history is well known to you, please allow me to reiterate it for the purpose of our medical record. Records and images were personally reviewed where available.  HISTORY OF PRESENT ILLNESS: This is a pleasant 46 year old right-handed woman with a history of bipolar disorder, anxiety, migraines, presenting for evaluation of the above symptoms. She reports that 2-1/2 months ago, she started having blurred vision and noticed problems with her peripheral vision to the point that her driving was affected. She noticed that when she tried to move to the right lane, she would almost hit someone. She stopped driving due to this. Around the same time, she started having trouble with stumbling and falling, she fell in the shower and felt like her feet would not cooperate. She tries to put one in front of the other and feels like her legs "get tangled up." Around a month ago, she started having tremors in both hands. Even when she holds her hands to her legs, she can't control the tremors. She can't write because she cannot control her pen. She has written illegible checks, has had difficulties texting and typing because she would hit the key next to her target. 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 She sees psychiatrist [name] who thinks that medications may be contributing. Per PCP notes, she had major adjustments around October where her doses of Lithium and Geodon were doubled, Trintellix added, and Wellbutrin reduced. She states she has been taking Lithium for 2.5 years, and reports that Lithium dose was decreased by 300mg  6 months ago and side effects  went away. She recalls having "lithium tremors" in the past, which are different from her current tremor. She reports Geodon dose was reduced 5-6 months ago. She has been taking Trintellix for 4-5 months, and Topamax for migraines and depression for 2 years. She has been on Wellbutrin for 2 years, Enbrel for 17 years, and takes Xanax 1mg  daily. She takes Trazodone for sleep, which helps her sleep for 8 hours. She has a history of migraines and has occasional headaches. She has occasional vertigo, closes her eyes and symptoms settle down in 1-2 minutes. She denies any nausea/vomiting, focal numbness/tingling. She has a lot of diarrhea. She reports back pain is better but her legs feel weak. Her mother has MS, her paternal grandfather had Parkinson's disease.   Laboratory Data: Lab Results  Component Value Date   WBC 9.6 01/08/2016   HGB 13.6 01/08/2016   HCT 39.5 01/08/2016   MCV 95.0 01/08/2016   PLT 261 01/08/2016     Chemistry      Component Value Date/Time   NA 140 01/08/2016 0928   K 4.3 01/08/2016 0928   CL 110 01/08/2016 0928   CO2 24 01/08/2016 0928   BUN 10 01/08/2016 0928   CREATININE 0.98 01/08/2016 0928      Component Value Date/Time   CALCIUM 9.4 01/08/2016 0928   ALKPHOS 51 01/08/2016 0928   AST 15 01/08/2016 0928   ALT 10 (L) 01/08/2016 0928   BILITOT 0.3 01/08/2016 0928     Lab Results  Component Value Date   TSH 3.830 02/11/2014     PAST MEDICAL  HISTORY: Past Medical History:  Diagnosis Date  . Anxiety   . Arthritis    soriatic  . Bipolar affective disorder, manic, mild (HCC)   . Depression   . Migraine headache   . PTSD (post-traumatic stress disorder)     PAST SURGICAL HISTORY: Past Surgical History:  Procedure Laterality Date  . CESAREAN SECTION    . CHOLECYSTECTOMY      MEDICATIONS: Current Outpatient Prescriptions on File Prior to Visit  Medication Sig Dispense Refill  . ALPRAZolam (XANAX XR) 1 MG 24 hr tablet Take 1 mg by mouth every  morning. May take 2 mg in the morning as needed    . buPROPion (WELLBUTRIN XL) 300 MG 24 hr tablet Take 300 mg by mouth daily.    . cetirizine (ZYRTEC) 10 MG tablet Take 10 mg by mouth daily as needed for allergies.    . clobetasol (OLUX) 0.05 % topical foam Apply 1 application topically 2 (two) times daily.  2  . cyclobenzaprine (FLEXERIL) 10 MG tablet Take 1 tablet (10 mg total) by mouth 2 (two) times daily as needed for muscle spasms. 20 tablet 0  . etanercept (ENBREL) 50 MG/ML injection Inject 0.98 mLs (50 mg total) into the skin every Sunday. For Psoraisis (Patient taking differently: Inject 50 mg into the skin 2 (two) times a week. For Psoraisis-- On Saturday and Tuesday.) 0.98 mL   . HYDROcodone-acetaminophen (NORCO) 5-325 MG tablet Take 1-2 tablets by mouth every 6 (six) hours as needed for moderate pain. 20 tablet 0  . hydrOXYzine (ATARAX/VISTARIL) 25 MG tablet Take 1 tablet (25 mg total) by mouth every 6 (six) hours as needed for anxiety. 30 tablet 0  . lithium 300 MG tablet Take 300-600 mg by mouth 2 (two) times daily. 300 mg in the morning and 600 mg at bedtime    . mirtazapine (REMERON) 15 MG tablet Take 15 mg by mouth at bedtime.  1  . naproxen (NAPROSYN) 500 MG tablet Take 1 tablet (500 mg total) by mouth 2 (two) times daily with a meal. 30 tablet 0  . norgestimate-ethinyl estradiol (ORTHO-CYCLEN,SPRINTEC,PREVIFEM) 0.25-35 MG-MCG tablet Take 1 tablet by mouth at bedtime. For Birth control method 1 Package 0  . traZODone (DESYREL) 50 MG tablet Take 1 tablet (50 mg total) by mouth at bedtime. For sleep 14 tablet 0   No current facility-administered medications on file prior to visit.     ALLERGIES: Allergies  Allergen Reactions  . Prednisone Other (See Comments)    Spikes mania episodes    FAMILY HISTORY: Family History  Problem Relation Age of Onset  . Depression Father   . Alcohol abuse Father   . Alcohol abuse Sister   . Depression Sister   . Alcohol abuse Maternal  Grandfather   . Multiple sclerosis Mother     SOCIAL HISTORY: Social History   Social History  . Marital status: Married    Spouse name: N/A  . Number of children: N/A  . Years of education: N/A   Occupational History  . Not on file.   Social History Main Topics  . Smoking status: Never Smoker  . Smokeless tobacco: Never Used  . Alcohol use Yes     Comment: once a month  . Drug use: No  . Sexual activity: Yes    Birth control/ protection: Pill   Other Topics Concern  . Not on file   Social History Narrative  . No narrative on file    REVIEW OF SYSTEMS: Constitutional:  No fevers, chills, or sweats, no generalized fatigue, change in appetite Eyes: No visual changes, double vision, eye pain Ear, nose and throat: No hearing loss, ear pain, nasal congestion, sore throat Cardiovascular: No chest pain, palpitations Respiratory:  No shortness of breath at rest or with exertion, wheezes GastrointestinaI: No nausea, vomiting, diarrhea, abdominal pain, fecal incontinence Genitourinary:  No dysuria, urinary retention or frequency Musculoskeletal:  No neck pain, +back pain Integumentary: No rash, pruritus, skin lesions Neurological: as above Psychiatric: No depression, insomnia, anxiety Endocrine: No palpitations, fatigue, diaphoresis, mood swings, change in appetite, change in weight, increased thirst Hematologic/Lymphatic:  No anemia, purpura, petechiae. Allergic/Immunologic: no itchy/runny eyes, nasal congestion, recent allergic reactions, rashes  PHYSICAL EXAM: Vitals:   05/10/16 1300  BP: 100/62  Pulse: 66   General: No acute distress Head:  Normocephalic/atraumatic Eyes: Fundoscopic exam shows bilateral sharp discs, no vessel changes, exudates, or hemorrhages Neck: supple, no paraspinal tenderness, full range of motion Back: No paraspinal tenderness Heart: regular rate and rhythm Lungs: Clear to auscultation bilaterally. Vascular: No carotid  bruits. Skin/Extremities: No rash, no edema Neurological Exam: Mental status: alert and oriented to person, place, and time, no dysarthria or aphasia, Fund of knowledge is appropriate.  Recent and remote memory are intact.  Attention and concentration are normal.    Able to name objects and repeat phrases. Cranial nerves: CN I: not tested CN II: pupils equal, round and reactive to light, visual acuity OD 20/70, OS 20/50, visual fields intact, fundi unremarkable. CN III, IV, VI:  full range of motion, no nystagmus, no ptosis CN V: facial sensation intact CN VII: upper and lower face symmetric CN VIII: hearing intact to finger rub CN IX, X: gag intact, uvula midline CN XI: sternocleidomastoid and trapezius muscles intact CN XII: tongue midline Bulk & Tone: bulk normal, no cogwheeling, no fasciculations. Motor: 5/5 throughout with no pronator drift but with note of bradykinesia with decreased toe tapping bilaterally. Good finger taps. Sensation: intact to light touch, cold, pin, vibration and joint position sense.  No extinction to double simultaneous stimulation.  Romberg test negative Deep Tendon Reflexes: +2 throughout, no ankle clonus Plantar responses: downgoing bilaterally Cerebellar: no incoordination on finger to nose, heel to shin. No dysdiadochokinesia Gait: narrow-based and steady with good arm swing, able to tandem walk adequately. Able to rise from chair with arms crossed. No postural instability. Tremor: no resting tremor. There is a high frequency, low to medium amplitude postural > action tremor (L>R with action tremor). See sheet attached, no micrographia, good spiral drawing with tremor noted on both hands.  IMPRESSION: This is a pleasant 46 year old right-handed woman with a history of bipolar disorder, anxiety, migraines, presenting for vision and gait changes, as well as worsening tremor. Her neurological exam shows slight difference in vision on the right eye, she feels she  cannot see out of the right side but there is no visual field defect noted today. Gait testing overall normal. MRI brain with and without contrast will be ordered to assess for underlying structural abnormality. She is noted to have a postural and action tremor with bradykinesia, suggestive of drug-induced parkinsonism. We discussed that this can be seen with Geodon and Lithium, she was advised to speak about this to her psychiatrist. She will follow-up in 3 months and knows to call for any changes.   Thank you for allowing me to participate in the care of this patient. Please do not hesitate to call for any questions or concerns.  Patrcia Dolly, M.D.  CC: Max Vinograd, Dr. Sallyanne Kuster [facility])

## 2016-05-10 NOTE — Patient Instructions (Signed)
1. Schedule MRI brain with and without contrast 2. Discuss Geodon and Lithium which can cause parkinsonism (tremors, slowed movements) with your psychiatrist 3. Follow-up in 3 months

## 2016-05-15 ENCOUNTER — Other Ambulatory Visit: Payer: Self-pay

## 2016-05-18 ENCOUNTER — Encounter: Payer: Self-pay | Admitting: Neurology

## 2016-05-19 ENCOUNTER — Ambulatory Visit
Admission: RE | Admit: 2016-05-19 | Discharge: 2016-05-19 | Disposition: A | Discharge status: Home / Self Care | Payer: BC Managed Care – PPO | Source: Ambulatory Visit | Attending: Neurology | Admitting: Neurology

## 2016-05-19 DIAGNOSIS — R2681 Unsteadiness on feet: Secondary | ICD-10-CM

## 2016-05-19 DIAGNOSIS — H539 Unspecified visual disturbance: Secondary | ICD-10-CM

## 2016-05-19 MED ORDER — GADOBENATE DIMEGLUMINE 529 MG/ML IV SOLN
13.0000 mL | Freq: Once | INTRAVENOUS | Status: AC | PRN
  Administered 2016-05-19: 13 mL via INTRAVENOUS

## 2016-05-23 ENCOUNTER — Telehealth: Payer: Self-pay | Admitting: Neurology

## 2016-05-23 NOTE — Telephone Encounter (Signed)
Patient wants the results of the MRI result please call [phone]

## 2016-05-24 NOTE — Telephone Encounter (Signed)
Please advise 

## 2016-05-24 NOTE — Telephone Encounter (Signed)
Discussed MRI results showing nonspecific white matter changes. Possibly related to migraine, offered to do a lumbar puncture, we have agreed to hold off for now. 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 She will call her psychiatrist and will f/u as scheduled in March.

## 2016-07-12 ENCOUNTER — Ambulatory Visit: Payer: Self-pay | Admitting: Neurology

## 2016-11-04 DIAGNOSIS — L659 Nonscarring hair loss, unspecified: Secondary | ICD-10-CM | POA: Insufficient documentation

## 2017-06-05 ENCOUNTER — Ambulatory Visit: Payer: BC Managed Care – PPO | Admitting: Psychiatry

## 2017-06-05 ENCOUNTER — Encounter: Payer: Self-pay | Admitting: Psychiatry

## 2017-06-05 ENCOUNTER — Telehealth: Payer: Self-pay | Admitting: *Deleted

## 2017-06-05 ENCOUNTER — Other Ambulatory Visit: Payer: Self-pay

## 2017-06-05 VITALS — BP 104/65 | HR 75 | Temp 98.6°F | Wt 152.4 lb

## 2017-06-05 DIAGNOSIS — F3130 Bipolar disorder, current episode depressed, mild or moderate severity, unspecified: Secondary | ICD-10-CM | POA: Diagnosis not present

## 2017-06-05 NOTE — Progress Notes (Signed)
ECT: This is an ECT consult and intake appointment for 47 year old woman with chronic mood disorder.  Patient referred by her outpatient psychiatrist Dr.Kaur.  Patient carries a diagnosis of bipolar disorder depressed.  Patient tells me that currently she feels "extremely depressed".  She says her mood stays down and sad almost all the time.  She is tearful frequently.  Her energy level is extremely low.  She does not have suicidal thoughts.  Does not have psychosis.  She has been compliant with prescribed medicine.  Patient is unable to work and has very little energy during the day.  Feels somewhat desperate about her illness.  She says her current symptoms have been going on since early December.  It she is currently being treated with a combination of lamotrigine, Latuda, Depakote and a small dose of Xanax.  Denies any substance abuse.  Past psychiatric history: Patient states that she has been having problems with her mood going back about 20 years.  For a long time she had a diagnosis of depression.  5 years ago this was changed to bipolar disorder.  She says she will go through cycles of either feeling extremely energetic and up or very depressed.  She has not had a manic episode however since last year when she was put on Depakote and Latuda.  The patient has had self-harm in the past.  She cut herself last winter.  Only one prior suicide attempt which was many years ago.  She has had previous hospitalizations.  Substance abuse history: Denies active or ongoing substance abuse problems  Medical history: Patient has psoriatic arthritis.  Also migraines.  Social history: Patient lives by herself.  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  Mental status exam: Neatly dressed and groomed woman looks her stated age.  Good eye contact.  Appropriate interaction.  Affect lightly constricted and depressed but appropriate.  Thoughts lucid.  No sign of delusions.  Denies auditory or visual  hallucinations.  Patient feels hopeless but denies suicidal ideation.  Patient is alert and oriented x4.  Shows good judgment and insight.  Understands her illness and the treatment plan.  Able to make appropriate decisions.  This is a 47 year old woman with chronic problems with mood instability still in a severe depression despite multiple trials of medications.  Patient does meet criteria as an appropriate ECT candidate.  We discussed ECT the potential benefits and risks.  Patient was given ample opportunity to ask questions.  I advised her that I thought there was a good chance that ECT could help with her acute symptoms but would not necessarily change her long-term course although we would be open to discussing maintenance if the initial trial works.  Patient understands the risks and benefits and is interested in pursuing ECT.  We have made a plan to tentatively start treatment 1 week from today on the 18th.  I advised the patient that I would like her to not take her Xanax the night before treatment but otherwise she can leave her medicine as it is for now.  I will let the nursing staff from ECT know about the tentative plan.  She has been sent off to get the full set of labs done.

## 2017-06-07 ENCOUNTER — Encounter
Admission: RE | Admit: 2017-06-07 | Discharge: 2017-06-07 | Disposition: A | Discharge status: Home / Self Care | Payer: BC Managed Care – PPO | Source: Ambulatory Visit | Attending: Psychiatry | Admitting: Psychiatry

## 2017-06-07 ENCOUNTER — Ambulatory Visit
Admission: RE | Admit: 2017-06-07 | Discharge: 2017-06-07 | Disposition: A | Discharge status: Home / Self Care | Payer: BC Managed Care – PPO | Source: Ambulatory Visit | Attending: Psychiatry | Admitting: Psychiatry

## 2017-06-07 ENCOUNTER — Ambulatory Visit: Admission: RE | Admit: 2017-06-07 | Payer: BC Managed Care – PPO | Source: Ambulatory Visit

## 2017-06-07 DIAGNOSIS — Z01818 Encounter for other preprocedural examination: Secondary | ICD-10-CM | POA: Diagnosis present

## 2017-06-07 DIAGNOSIS — Z01812 Encounter for preprocedural laboratory examination: Secondary | ICD-10-CM | POA: Insufficient documentation

## 2017-06-07 DIAGNOSIS — Z01810 Encounter for preprocedural cardiovascular examination: Secondary | ICD-10-CM | POA: Diagnosis not present

## 2017-06-07 DIAGNOSIS — F329 Major depressive disorder, single episode, unspecified: Secondary | ICD-10-CM | POA: Insufficient documentation

## 2017-06-07 LAB — URINALYSIS, COMPLETE (UACMP) WITH MICROSCOPIC
Bacteria, UA: NONE SEEN
Bilirubin Urine: NEGATIVE
Glucose, UA: NEGATIVE mg/dL
Ketones, ur: NEGATIVE mg/dL
Leukocytes, UA: NEGATIVE
Nitrite: NEGATIVE
PH: 5 (ref 5.0–8.0)
Protein, ur: NEGATIVE mg/dL
SPECIFIC GRAVITY, URINE: 1.019 (ref 1.005–1.030)

## 2017-06-07 LAB — BASIC METABOLIC PANEL
Anion gap: 11 (ref 5–15)
BUN: 19 mg/dL (ref 6–20)
CO2: 26 mmol/L (ref 22–32)
Calcium: 9.4 mg/dL (ref 8.9–10.3)
Chloride: 101 mmol/L (ref 101–111)
Creatinine, Ser: 0.96 mg/dL (ref 0.44–1.00)
GFR calc Af Amer: >60 mL/min (ref >60)
Glucose, Bld: 85 mg/dL (ref 65–99)
POTASSIUM: 3.8 mmol/L (ref 3.5–5.1)
SODIUM: 138 mmol/L (ref 135–145)

## 2017-06-07 LAB — CBC
HCT: 47.5 % — ABNORMAL HIGH (ref 35.0–47.0)
Hemoglobin: 16 g/dL (ref 12.0–16.0)
MCH: 32.9 pg (ref 26.0–34.0)
MCHC: 33.8 g/dL (ref 32.0–36.0)
MCV: 97.2 fL (ref 80.0–100.0)
PLATELETS: 211 10*3/uL (ref 150–440)
RBC: 4.88 MIL/uL (ref 3.80–5.20)
RDW: 11.7 % (ref 11.5–14.5)
WBC: 8.6 10*3/uL (ref 3.6–11.0)

## 2017-06-07 NOTE — Pre-Procedure Instructions (Signed)
Patient seen in Pre-Admit testing. New patient instruction reviewed. Medication reconciliation done, and a copy of the ECT Consent Form given to the patient. The patient verbalized understanding of everything. The booklet What you should know about Electroconvulsive Therapy was given to the patient. When asked if she had any questions she asked,"At what point can I change my mind regarding receiving ECT?" This nurse reassured her that she had until the actual procedure is performed. The ECT office number was given and the patient was told to call and leave a message if she wished to cancel or reschedule for a later date. The patient verbalized understanding.

## 2017-06-09 ENCOUNTER — Telehealth: Payer: Self-pay

## 2017-06-11 ENCOUNTER — Other Ambulatory Visit: Payer: Self-pay | Admitting: Psychiatry

## 2017-06-12 ENCOUNTER — Encounter: Payer: Self-pay | Admitting: Certified Registered Nurse Anesthetist

## 2017-06-12 ENCOUNTER — Encounter
Admission: RE | Admit: 2017-06-12 | Discharge: 2017-06-12 | Disposition: A | Discharge status: Home / Self Care | Payer: BC Managed Care – PPO | Source: Ambulatory Visit | Attending: Psychiatry | Admitting: Psychiatry

## 2017-06-12 DIAGNOSIS — Z888 Allergy status to other drugs, medicaments and biological substances status: Secondary | ICD-10-CM | POA: Diagnosis not present

## 2017-06-12 DIAGNOSIS — F4310 Post-traumatic stress disorder, unspecified: Secondary | ICD-10-CM | POA: Insufficient documentation

## 2017-06-12 DIAGNOSIS — F3130 Bipolar disorder, current episode depressed, mild or moderate severity, unspecified: Secondary | ICD-10-CM | POA: Diagnosis not present

## 2017-06-12 DIAGNOSIS — Z9049 Acquired absence of other specified parts of digestive tract: Secondary | ICD-10-CM | POA: Diagnosis not present

## 2017-06-12 DIAGNOSIS — F332 Major depressive disorder, recurrent severe without psychotic features: Secondary | ICD-10-CM | POA: Insufficient documentation

## 2017-06-12 MED ORDER — KETOROLAC TROMETHAMINE 30 MG/ML IJ SOLN
30.0000 mg | Freq: Once | INTRAMUSCULAR | Status: AC
  Administered 2017-06-12: 30 mg via INTRAVENOUS

## 2017-06-12 MED ORDER — FENTANYL CITRATE (PF) 100 MCG/2ML IJ SOLN: 25.0000 ug | INTRAMUSCULAR | Status: DC | PRN

## 2017-06-12 MED ORDER — SODIUM CHLORIDE 0.9 % IV SOLN
INTRAVENOUS | Status: DC | PRN
  Administered 2017-06-12: 10:00:00 via INTRAVENOUS

## 2017-06-12 MED ORDER — KETOROLAC TROMETHAMINE 30 MG/ML IJ SOLN
INTRAMUSCULAR | Status: AC
  Filled 2017-06-12: qty 1

## 2017-06-12 MED ORDER — METHOHEXITAL SODIUM 100 MG/10ML IV SOSY
PREFILLED_SYRINGE | INTRAVENOUS | Status: DC | PRN
  Administered 2017-06-12: 70 mg via INTRAVENOUS

## 2017-06-12 MED ORDER — ONDANSETRON HCL 4 MG/2ML IJ SOLN: 4.0000 mg | Freq: Once | INTRAMUSCULAR | Status: DC | PRN

## 2017-06-12 MED ORDER — SODIUM CHLORIDE 0.9 % IV SOLN
500.0000 mL | Freq: Once | INTRAVENOUS | Status: AC
  Administered 2017-06-12: 500 mL via INTRAVENOUS

## 2017-06-12 MED ORDER — ONDANSETRON HCL 4 MG/2ML IJ SOLN
INTRAMUSCULAR | Status: AC
  Filled 2017-06-12: qty 2

## 2017-06-12 MED ORDER — SUCCINYLCHOLINE CHLORIDE 20 MG/ML IJ SOLN
INTRAMUSCULAR | Status: DC | PRN
  Administered 2017-06-12: 80 mg via INTRAVENOUS

## 2017-06-12 MED ORDER — ONDANSETRON HCL 4 MG/2ML IJ SOLN
4.0000 mg | Freq: Once | INTRAMUSCULAR | Status: AC
  Administered 2017-06-12: 4 mg via INTRAVENOUS

## 2017-06-12 NOTE — Anesthesia Postprocedure Evaluation (Signed)
Anesthesia Post Note  Patient: Katelyn [name]  Procedure(s) Performed: ECT TX  Patient location during evaluation: PACU Anesthesia Type: General Level of consciousness: awake and alert Pain management: pain level controlled Vital Signs Assessment: post-procedure vital signs reviewed and stable Respiratory status: spontaneous breathing, nonlabored ventilation, respiratory function stable and patient connected to nasal cannula oxygen Cardiovascular status: blood pressure returned to baseline and stable Postop Assessment: no apparent nausea or vomiting Anesthetic complications: no     Last Vitals:  Vitals:   06/12/17 1123 06/12/17 1132  BP: (!) 106/36 (!) 96/53  Pulse: 75 81  Resp: 13 16  Temp: 37.2 C   SpO2: 99%     Last Pain:  Vitals:   06/12/17 1132  TempSrc:   PainSc: 3                  Trysta Showman G Josha Weekley     

## 2017-06-12 NOTE — Transfer of Care (Signed)
Immediate Anesthesia Transfer of Care Note  Patient: Katelyn [name]  Procedure(s) Performed: ECT TX  Patient Location: PACU  Anesthesia Type:General  Level of Consciousness: awake and alert   Airway & Oxygen Therapy: Patient Spontanous Breathing and Patient connected to face mask oxygen  Post-op Assessment: Report given to RN and Post -op Vital signs reviewed and stable  Post vital signs: Reviewed and stable  Last Vitals:  Vitals:   06/12/17 0826  BP: (!) 95/47  Pulse: 73  Resp: 14  Temp: 36.9 C  SpO2: 100%    Last Pain:  Vitals:   06/12/17 0826  TempSrc: Oral  PainSc: 3          Complications: No apparent anesthesia complications

## 2017-06-12 NOTE — Anesthesia Postprocedure Evaluation (Signed)
Anesthesia Post Note  Patient: Katelyn [name]  Procedure(s) Performed: ECT TX  Patient location during evaluation: PACU Anesthesia Type: General Level of consciousness: awake and alert Pain management: pain level controlled Vital Signs Assessment: post-procedure vital signs reviewed and stable Respiratory status: spontaneous breathing, nonlabored ventilation, respiratory function stable and patient connected to nasal cannula oxygen Cardiovascular status: blood pressure returned to baseline and stable Postop Assessment: no apparent nausea or vomiting Anesthetic complications: no     Last Vitals:  Vitals:   06/12/17 1123 06/12/17 1132  BP: (!) 106/36 (!) 96/53  Pulse: 75 81  Resp: 13 16  Temp: 37.2 C   SpO2: 99%     Last Pain:  Vitals:   06/12/17 1132  TempSrc:   PainSc: 3                  Yevette EdwardsJames G Adams

## 2017-06-12 NOTE — Anesthesia Post-op Follow-up Note (Signed)
Anesthesia QCDR form completed.        

## 2017-06-12 NOTE — Procedures (Signed)
ECT SERVICES Physician's Interval Evaluation & Treatment Note  Patient Identification: Katelyn ChurchChrista [name] MRN:  [id number] Date of Evaluation:  06/12/2017 TX #: 1  MADRS:   MMSE:   P.E. Findings:  No change to physical exam heart and lungs normal vitals unremarkable  Psychiatric Interval Note:  Flat and blunted depressed  Subjective:  Patient is a 47 y.o. female seen for evaluation for Electroconvulsive Therapy. Feeling depressed not manic passive suicidal thought no intent or plan  Treatment Summary:   [x]   Right Unilateral             []  Bilateral   % Energy : 0.3 ms 50%   Impedance: 2340 ohms  Seizure Energy Index: No reading  Postictal Suppression Index: No reading  Seizure Concordance Index: No reading  Medications  Pre Shock: Toradol 30 mg Zofran 4 mg Brevital 70 mg succinylcholine 80 mg  Post Shock: None  Seizure Duration: 9 seconds by EMG 13 seconds by EEG   Comments: Short seizure with poor indices.  Increase energy to 100% next time reconsider medication treatment  Lungs:  [x]   Clear to auscultation               []  Other:   Heart:    [x]   Regular rhythm             []  irregular rhythm    [x]   Previous H&P reviewed, patient examined and there are NO CHANGES                 []   Previous H&P reviewed, patient examined and there are changes noted.   Mordecai RasmussenJohn Altan Kraai, MD 2/18/201910:50 AM

## 2017-06-12 NOTE — Discharge Instructions (Signed)
1)  The drugs that you have been given will stay in your system until tomorrow so for the       next 24 hours you should not:  A. Drive an automobile  B. Make any legal decisions  C. Drink any alcoholic beverages  2)  You may resume your regular meals upon return home.  3)  A responsible adult must take you home.  Someone should stay with you for a few          hours, then be available by phone for the remainder of the treatment day.  4)  You May experience any of the following symptoms:  Headache, Nausea and a dry mouth (due to the medications you were given),  temporary memory loss and some confusion, or sore muscles (a warm bath  should help this).  If you you experience any of these symptoms let us know on                your return visit.  5)  Report any of the following: any acute discomfort, severe headache, or temperature        greater than 100.5 F.   Also report any unusual redness, swelling, drainage, or pain         at your IV site.    You may report Symptoms to:  [facility]          Phone: [phone], ECT Department           or Dr. Shary Keylapac's office [phone]  6)  Your next ECT Treatment is Wednesday  June 14 828  We will call 2 days prior to your scheduled appointment for arrival times.  7)  Nothing to eat or drink after midnight the night before your procedure.  8)  Take      With a sip of water the morning of your procedure.  9)  Other Instructions: Call [phone] to cancel the morning of your procedure due         to illness or emergency.  10) We will call within 72 hours to assess how you are feeling.

## 2017-06-12 NOTE — Anesthesia Preprocedure Evaluation (Signed)
Anesthesia Evaluation  Patient identified by MRN, date of birth, ID band Patient awake    Reviewed: Allergy & Precautions, H&P , NPO status , Patient's Chart, lab work & pertinent test results, reviewed documented beta blocker date and time   Airway Mallampati: II   Neck ROM: full    Dental  (+) Poor Dentition   Pulmonary neg pulmonary ROS,    Pulmonary exam normal        Cardiovascular negative cardio ROS Normal cardiovascular exam Rhythm:regular Rate:Normal     Neuro/Psych negative neurological ROS  negative psych ROS   GI/Hepatic negative GI ROS, Neg liver ROS,   Endo/Other  negative endocrine ROS  Renal/GU negative Renal ROS  negative genitourinary   Musculoskeletal   Abdominal   Peds  Hematology negative hematology ROS (+)   Anesthesia Other Findings Past Medical History: No date: Anxiety No date: Arthritis     Comment:  soriatic No date: Bipolar affective disorder, manic, mild (HCC) No date: Depression No date: Migraine headache No date: PTSD (post-traumatic stress disorder) Past Surgical History: No date: CESAREAN SECTION No date: CHOLECYSTECTOMY BMI    Body Mass Index:  27.44 kg/m     Reproductive/Obstetrics negative OB ROS                             Anesthesia Physical Anesthesia Plan  ASA: III  Anesthesia Plan: General   Post-op Pain Management:    Induction:   PONV Risk Score and Plan:   Airway Management Planned:   Additional Equipment:   Intra-op Plan:   Post-operative Plan:   Informed Consent: I have reviewed the patients History and Physical, chart, labs and discussed the procedure including the risks, benefits and alternatives for the proposed anesthesia with the patient or authorized representative who has indicated his/her understanding and acceptance.   Dental Advisory Given  Plan Discussed with: CRNA  Anesthesia Plan Comments:          Anesthesia Quick Evaluation

## 2017-06-12 NOTE — H&P (Signed)
Katelyn ChurchChrista [name] is an 47 y.o. female.   Chief Complaint: Patient has recurrent severe depression.  Remains depressed down and negative but no psychosis not actively suicidal HPI: History of recurrent severe depression first treatment with ECT treatment beginning today with ECT continue medication management  Past Medical History:  Diagnosis Date  . Anxiety   . Arthritis    soriatic  . Bipolar affective disorder, manic, mild (HCC)   . Depression   . Migraine headache   . PTSD (post-traumatic stress disorder)     Past Surgical History:  Procedure Laterality Date  . CESAREAN SECTION    . CHOLECYSTECTOMY      Family History  Problem Relation Age of Onset  . Alcohol abuse Sister   . Depression Sister   . Alcohol abuse Maternal Grandfather   . Multiple sclerosis Mother    Social History:  reports that  has never smoked. she has never used smokeless tobacco. She reports that she drinks alcohol. She reports that she does not use drugs.  Allergies:  Allergies  Allergen Reactions  . Prednisone Other (See Comments)    Spikes mania episodes     (Not in a hospital admission)  No results found for this or any previous visit (from the past 48 hour(s)). No results found.  Review of Systems  Constitutional: Negative.   HENT: Negative.   Eyes: Negative.   Respiratory: Negative.   Cardiovascular: Negative.   Gastrointestinal: Negative.   Musculoskeletal: Negative.   Skin: Negative.   Neurological: Negative.   Psychiatric/Behavioral: Positive for depression. Negative for hallucinations, memory loss, substance abuse and suicidal ideas. The patient is nervous/anxious and has insomnia.     Blood pressure (!) 95/47, pulse 73, temperature 98.4 F (36.9 C), temperature source Oral, resp. rate 14, height 5\' 2"  (1.575 m), weight 68 kg (150 lb), last menstrual period 05/16/2017, SpO2 100 %. Physical Exam  Nursing note and vitals reviewed. Constitutional: She appears well-developed  and well-nourished.  HENT:  Head: Normocephalic and atraumatic.  Eyes: Conjunctivae are normal. Pupils are equal, round, and reactive to light.  Neck: Normal range of motion.  Cardiovascular: Regular rhythm and normal heart sounds.  Respiratory: Effort normal. No respiratory distress.  GI: Soft.  Musculoskeletal: Normal range of motion.  Neurological: She is alert.  Skin: Skin is warm and dry.  Psychiatric: Judgment normal. Her affect is blunt. Her speech is delayed. She is slowed. Cognition and memory are normal. She expresses no suicidal ideation.     Assessment/Plan Continue ECT treatment Monday Wednesday and Friday right unilateral treatment  Mordecai RasmussenJohn Clapacs, MD 06/12/2017, 10:40 AM

## 2017-06-12 NOTE — Anesthesia Procedure Notes (Signed)
Performed by: Maegan Buller, CRNA Pre-anesthesia Checklist: Patient identified, Patient being monitored, Timeout performed, Emergency Drugs available and Suction available Patient Re-evaluated:Patient Re-evaluated prior to induction Oxygen Delivery Method: Circle system utilized Preoxygenation: Pre-oxygenation with 100% oxygen Ventilation: Mask ventilation without difficulty Airway Equipment and Method: Bite block Dental Injury: Teeth and Oropharynx as per pre-operative assessment        

## 2017-06-13 ENCOUNTER — Telehealth (HOSPITAL_COMMUNITY): Payer: Self-pay | Admitting: *Deleted

## 2017-06-13 ENCOUNTER — Other Ambulatory Visit: Payer: Self-pay | Admitting: Psychiatry

## 2017-06-13 NOTE — Telephone Encounter (Signed)
Called for prior authorization of ECT. Was told to complete paperwork and fax to [facility] at [phone]. Filled out and faxed. Awaiting response.

## 2017-06-14 ENCOUNTER — Encounter
Admission: RE | Admit: 2017-06-14 | Discharge: 2017-06-14 | Disposition: A | Discharge status: Home / Self Care | Payer: BC Managed Care – PPO | Source: Ambulatory Visit | Attending: Psychiatry | Admitting: Psychiatry

## 2017-06-14 ENCOUNTER — Encounter: Payer: Self-pay | Admitting: Anesthesiology

## 2017-06-14 DIAGNOSIS — F4310 Post-traumatic stress disorder, unspecified: Secondary | ICD-10-CM | POA: Insufficient documentation

## 2017-06-14 DIAGNOSIS — Z9889 Other specified postprocedural states: Secondary | ICD-10-CM | POA: Diagnosis not present

## 2017-06-14 DIAGNOSIS — F319 Bipolar disorder, unspecified: Secondary | ICD-10-CM | POA: Insufficient documentation

## 2017-06-14 DIAGNOSIS — Z9049 Acquired absence of other specified parts of digestive tract: Secondary | ICD-10-CM | POA: Insufficient documentation

## 2017-06-14 DIAGNOSIS — Z888 Allergy status to other drugs, medicaments and biological substances status: Secondary | ICD-10-CM | POA: Insufficient documentation

## 2017-06-14 DIAGNOSIS — Z811 Family history of alcohol abuse and dependence: Secondary | ICD-10-CM | POA: Insufficient documentation

## 2017-06-14 DIAGNOSIS — F314 Bipolar disorder, current episode depressed, severe, without psychotic features: Secondary | ICD-10-CM

## 2017-06-14 DIAGNOSIS — Z818 Family history of other mental and behavioral disorders: Secondary | ICD-10-CM | POA: Insufficient documentation

## 2017-06-14 LAB — POCT PREGNANCY, URINE: Preg Test, Ur: NEGATIVE

## 2017-06-14 MED ORDER — KETOROLAC TROMETHAMINE 30 MG/ML IJ SOLN
INTRAMUSCULAR | Status: AC
  Filled 2017-06-14: qty 1

## 2017-06-14 MED ORDER — SUCCINYLCHOLINE CHLORIDE 200 MG/10ML IV SOSY
PREFILLED_SYRINGE | INTRAVENOUS | Status: DC | PRN
  Administered 2017-06-14: 100 mg via INTRAVENOUS

## 2017-06-14 MED ORDER — SODIUM CHLORIDE 0.9 % IV SOLN
500.0000 mL | Freq: Once | INTRAVENOUS | Status: AC
  Administered 2017-06-14: 500 mL via INTRAVENOUS

## 2017-06-14 MED ORDER — FENTANYL CITRATE (PF) 100 MCG/2ML IJ SOLN: 25.0000 ug | INTRAMUSCULAR | Status: DC | PRN

## 2017-06-14 MED ORDER — KETOROLAC TROMETHAMINE 30 MG/ML IJ SOLN
30.0000 mg | Freq: Once | INTRAMUSCULAR | Status: AC
  Administered 2017-06-14: 30 mg via INTRAVENOUS

## 2017-06-14 MED ORDER — SODIUM CHLORIDE 0.9 % IV SOLN
INTRAVENOUS | Status: DC | PRN
  Administered 2017-06-14: 09:00:00 via INTRAVENOUS

## 2017-06-14 MED ORDER — METHOHEXITAL SODIUM 0.5 G IJ SOLR
INTRAMUSCULAR | Status: AC
  Filled 2017-06-14: qty 500

## 2017-06-14 MED ORDER — METHOHEXITAL SODIUM 100 MG/10ML IV SOSY
PREFILLED_SYRINGE | INTRAVENOUS | Status: DC | PRN
  Administered 2017-06-14: 70 mg via INTRAVENOUS

## 2017-06-14 MED ORDER — ONDANSETRON HCL 4 MG/2ML IJ SOLN: 4.0000 mg | Freq: Once | INTRAMUSCULAR | Status: DC | PRN

## 2017-06-14 MED ORDER — SUCCINYLCHOLINE CHLORIDE 20 MG/ML IJ SOLN
INTRAMUSCULAR | Status: AC
  Filled 2017-06-14: qty 1

## 2017-06-14 NOTE — H&P (Signed)
Katelyn [name] is an 47 y.o. female.   Chief Complaint: Patient continues to have significant depression part of long-term chronic depression and mood instability HPI: History of chronic depression or bipolar disorder currently presenting as extremely depressed for weeks to months  Past Medical History:  Diagnosis Date  . Anxiety   . Arthritis    soriatic  . Bipolar affective disorder, manic, mild (HCC)   . Depression   . Migraine headache   . PTSD (post-traumatic stress disorder)     Past Surgical History:  Procedure Laterality Date  . CESAREAN SECTION    . CHOLECYSTECTOMY      Family History  Problem Relation Age of Onset  . Alcohol abuse Sister   . Depression Sister   . Alcohol abuse Maternal Grandfather   . Multiple sclerosis Mother    Social History:  reports that  has never smoked. she has never used smokeless tobacco. She reports that she drinks alcohol. She reports that she does not use drugs.  Allergies:  Allergies  Allergen Reactions  . Prednisone Other (See Comments)    Spikes mania episodes     (Not in a hospital admission)  Results for orders placed or performed during the hospital encounter of 06/14/17 (from the past 48 hour(s))  Pregnancy, urine POC     Status: None   Collection Time: 06/14/17  8:59 AM  Result Value Ref Range   Preg Test, Ur NEGATIVE NEGATIVE    Comment:        THE SENSITIVITY OF THIS METHODOLOGY IS >24 mIU/mL    No results found.  Review of Systems  Constitutional: Negative.   HENT: Negative.   Eyes: Negative.   Respiratory: Negative.   Cardiovascular: Negative.   Gastrointestinal: Negative.   Musculoskeletal: Positive for myalgias.  Skin: Negative.   Neurological: Negative.   Psychiatric/Behavioral: Positive for depression. Negative for hallucinations, memory loss, substance abuse and suicidal ideas. The patient is nervous/anxious. The patient does not have insomnia.     Blood pressure (!) 110/44, pulse 76,  temperature 98.3 F (36.8 C), temperature source Oral, resp. rate 18, height 5\' 2"  (1.575 m), weight 68 kg (150 lb), last menstrual period 05/16/2017, SpO2 100 %. Physical Exam  Nursing note and vitals reviewed. Constitutional: She appears well-developed and well-nourished.  HENT:  Head: Normocephalic and atraumatic.  Eyes: Conjunctivae are normal. Pupils are equal, round, and reactive to light.  Neck: Normal range of motion.  Cardiovascular: Regular rhythm and normal heart sounds.  Respiratory: Effort normal. No respiratory distress.  GI: Soft.  Musculoskeletal: Normal range of motion.  Neurological: She is alert.  Skin: Skin is warm and dry.  Psychiatric: Judgment normal. Her mood appears anxious. Her affect is blunt. Her speech is delayed. She is slowed. Cognition and memory are normal. She exhibits a depressed mood. She expresses no suicidal ideation.     Assessment/Plan Continue index course of ECT.  I have asked the patient to discontinue her Depakote after today's treatment.  Mordecai RasmussenJohn Vale Peraza, MD 06/14/2017, 10:15 AM

## 2017-06-14 NOTE — Transfer of Care (Signed)
Immediate Anesthesia Transfer of Care Note  Patient: Katelyn [name]  Procedure(s) Performed: ECT TX  Patient Location: PACU  Anesthesia Type:General  Level of Consciousness: sedated  Airway & Oxygen Therapy: Patient Spontanous Breathing and Patient connected to face mask oxygen  Post-op Assessment: Report given to RN and Post -op Vital signs reviewed and stable  Post vital signs: Reviewed and stable  Last Vitals:  Vitals:   06/14/17 0826 06/14/17 1030  BP: (!) 110/44 (!) 109/52  Pulse: 76 79  Resp: 18 16  Temp: 36.8 C (!) 36.3 C  SpO2: 100% 100%    Last Pain:  Vitals:   06/14/17 1030  TempSrc:   PainSc: Asleep         Complications: No apparent anesthesia complications

## 2017-06-14 NOTE — Anesthesia Post-op Follow-up Note (Signed)
Anesthesia QCDR form completed.        

## 2017-06-14 NOTE — Anesthesia Preprocedure Evaluation (Signed)
Anesthesia Evaluation  Patient identified by MRN, date of birth, ID band Patient awake    Reviewed: Allergy & Precautions, H&P , NPO status , Patient's Chart, lab work & pertinent test results, reviewed documented beta blocker date and time   Airway Mallampati: II   Neck ROM: full    Dental  (+) Poor Dentition   Pulmonary neg pulmonary ROS,    Pulmonary exam normal        Cardiovascular negative cardio ROS Normal cardiovascular exam Rhythm:regular Rate:Normal     Neuro/Psych  Headaches, PSYCHIATRIC DISORDERS Anxiety Depression Bipolar Disorder  Neuromuscular disease negative neurological ROS  negative psych ROS   GI/Hepatic negative GI ROS, Neg liver ROS,   Endo/Other  negative endocrine ROS  Renal/GU negative Renal ROS  negative genitourinary   Musculoskeletal   Abdominal   Peds  Hematology negative hematology ROS (+)   Anesthesia Other Findings Past Medical History: No date: Anxiety No date: Arthritis     Comment:  soriatic No date: Bipolar affective disorder, manic, mild (HCC) No date: Depression No date: Migraine headache No date: PTSD (post-traumatic stress disorder) Past Surgical History: No date: CESAREAN SECTION No date: CHOLECYSTECTOMY BMI    Body Mass Index:  27.44 kg/m     Reproductive/Obstetrics negative OB ROS                             Anesthesia Physical Anesthesia Plan  ASA: II  Anesthesia Plan: General   Post-op Pain Management:    Induction:   PONV Risk Score and Plan:   Airway Management Planned:   Additional Equipment:   Intra-op Plan:   Post-operative Plan:   Informed Consent: I have reviewed the patients History and Physical, chart, labs and discussed the procedure including the risks, benefits and alternatives for the proposed anesthesia with the patient or authorized representative who has indicated his/her understanding and acceptance.    Dental Advisory Given  Plan Discussed with: CRNA  Anesthesia Plan Comments:         Anesthesia Quick Evaluation

## 2017-06-14 NOTE — Anesthesia Postprocedure Evaluation (Signed)
Anesthesia Post Note  Patient: Katelyn [name]  Procedure(s) Performed: ECT TX  Patient location during evaluation: PACU Anesthesia Type: General Level of consciousness: awake and alert Pain management: pain level controlled Vital Signs Assessment: post-procedure vital signs reviewed and stable Respiratory status: spontaneous breathing, nonlabored ventilation, respiratory function stable and patient connected to nasal cannula oxygen Cardiovascular status: blood pressure returned to baseline and stable Postop Assessment: no apparent nausea or vomiting Anesthetic complications: no     Last Vitals:  Vitals:   06/14/17 1056 06/14/17 1106  BP: (!) 109/47 102/74  Pulse: 78 78  Resp: 16 16  Temp:  36.6 C  SpO2: 100%     Last Pain:  Vitals:   06/14/17 1106  TempSrc: Oral  PainSc:                  Yevette EdwardsJames G Adams

## 2017-06-14 NOTE — Procedures (Signed)
ECT SERVICES Physician's Interval Evaluation & Treatment Note  Patient Identification: Katelyn ChurchChrista [name] MRN:  [id number] Date of Evaluation:  06/14/2017 TX #: 2  MADRS:   MMSE:   P.E. Findings:  Complaints of mild pain but no change to physical exam  Psychiatric Interval Note:  Still depressed not psychotic no suicidal thought  Subjective:  Patient is a 47 y.o. female seen for evaluation for Electroconvulsive Therapy. No change to complaints  Treatment Summary:   [x]   Right Unilateral             []  Bilateral   % Energy : 0.3 ms 100%   Impedance: 2250 ohms  Seizure Energy Index: 2498 V squared  Postictal Suppression Index: 90%  Seizure Concordance Index: No reading obtained  Medications  Pre Shock: Toradol 30 mg Zofran 4 mg Brevital 70 mg succinylcholine 100 mg  Post Shock:    Seizure Duration: 6 seconds by EMG 9 seconds by EEG   Comments: Seizure of an adequate length despite turning the energy up to 100%.  I have asked the patient to please discontinue Depakote.  And we will also change the primary anesthetic to ketamine  Lungs:  [x]   Clear to auscultation               []  Other:   Heart:    [x]   Regular rhythm             []  irregular rhythm    [x]   Previous H&P reviewed, patient examined and there are NO CHANGES                 []   Previous H&P reviewed, patient examined and there are changes noted.   Mordecai RasmussenJohn Whitaker Holderman, MD 2/20/201910:28 AM

## 2017-06-14 NOTE — Anesthesia Procedure Notes (Signed)
Date/Time: 06/14/2017 10:25 AM Performed by: Lily KocherPeralta, Avanelle Pixley, CRNA Pre-anesthesia Checklist: Patient identified, Emergency Drugs available, Suction available and Patient being monitored Patient Re-evaluated:Patient Re-evaluated prior to induction Oxygen Delivery Method: Circle system utilized Preoxygenation: Pre-oxygenation with 100% oxygen Induction Type: IV induction Ventilation: Mask ventilation without difficulty and Mask ventilation throughout procedure Airway Equipment and Method: Bite block Placement Confirmation: positive ETCO2 Dental Injury: Teeth and Oropharynx as per pre-operative assessment

## 2017-06-14 NOTE — Discharge Instructions (Signed)
1)  The drugs that you have been given will stay in your system until tomorrow so for the       next 24 hours you should not:  A. Drive an automobile  B. Make any legal decisions  C. Drink any alcoholic beverages  2)  You may resume your regular meals upon return home.  3)  A responsible adult must take you home.  Someone should stay with you for a few          hours, then be available by phone for the remainder of the treatment day.  4)  You May experience any of the following symptoms:  Headache, Nausea and a dry mouth (due to the medications you were given),  temporary memory loss and some confusion, or sore muscles (a warm bath  should help this).  If you you experience any of these symptoms let us know on                your return visit.  5)  Report any of the following: any acute discomfort, severe headache, or temperature        greater than 100.5 F.   Also report any unusual redness, swelling, drainage, or pain         at your IV site.    You may report Symptoms to:  ECT [facility]- [place] [facility]          Phone: [phone], ECT Department           or Dr. Shary Keylapac's office [phone]  6)  Your next ECT Treatment is Day Friday  Date June 16, 2016 at 0830  We will call 2 days prior to your scheduled appointment for arrival times.  7)  Nothing to eat or drink after midnight the night before your procedure.  8)  Take .     With a sip of water the morning of your procedure.  9)  Other Instructions: Call [phone] to cancel the morning of your procedure due         to illness or emergency.  10) We will call within 72 hours to assess how you are feeling.

## 2017-06-15 ENCOUNTER — Other Ambulatory Visit: Payer: Self-pay | Admitting: Psychiatry

## 2017-06-16 ENCOUNTER — Encounter: Payer: Self-pay | Admitting: Anesthesiology

## 2017-06-16 ENCOUNTER — Encounter
Admission: RE | Admit: 2017-06-16 | Discharge: 2017-06-16 | Disposition: A | Discharge status: Home / Self Care | Payer: BC Managed Care – PPO | Source: Ambulatory Visit | Attending: Psychiatry | Admitting: Psychiatry

## 2017-06-16 DIAGNOSIS — F3130 Bipolar disorder, current episode depressed, mild or moderate severity, unspecified: Secondary | ICD-10-CM | POA: Diagnosis not present

## 2017-06-16 DIAGNOSIS — G43909 Migraine, unspecified, not intractable, without status migrainosus: Secondary | ICD-10-CM | POA: Insufficient documentation

## 2017-06-16 DIAGNOSIS — F3111 Bipolar disorder, current episode manic without psychotic features, mild: Secondary | ICD-10-CM | POA: Diagnosis present

## 2017-06-16 DIAGNOSIS — F4310 Post-traumatic stress disorder, unspecified: Secondary | ICD-10-CM | POA: Diagnosis not present

## 2017-06-16 MED ORDER — KETOROLAC TROMETHAMINE 30 MG/ML IJ SOLN
INTRAMUSCULAR | Status: AC
  Filled 2017-06-16: qty 1

## 2017-06-16 MED ORDER — SODIUM CHLORIDE 0.9 % IV SOLN
INTRAVENOUS | Status: DC | PRN
  Administered 2017-06-16 (×2): via INTRAVENOUS

## 2017-06-16 MED ORDER — SODIUM CHLORIDE 0.9 % IV SOLN
500.0000 mL | Freq: Once | INTRAVENOUS | Status: AC
  Administered 2017-06-16: 500 mL via INTRAVENOUS

## 2017-06-16 MED ORDER — ONDANSETRON HCL 4 MG/2ML IJ SOLN: 4.0000 mg | Freq: Once | INTRAMUSCULAR | Status: DC | PRN

## 2017-06-16 MED ORDER — FENTANYL CITRATE (PF) 100 MCG/2ML IJ SOLN: 25.0000 ug | INTRAMUSCULAR | Status: DC | PRN

## 2017-06-16 MED ORDER — KETAMINE HCL 10 MG/ML IJ SOLN
INTRAMUSCULAR | Status: DC | PRN
  Administered 2017-06-16: 100 mg via INTRAVENOUS

## 2017-06-16 MED ORDER — ONDANSETRON HCL 4 MG/2ML IJ SOLN
INTRAMUSCULAR | Status: AC
  Administered 2017-06-16: 4 mg via INTRAVENOUS
  Filled 2017-06-16: qty 2

## 2017-06-16 MED ORDER — ONDANSETRON HCL 4 MG/2ML IJ SOLN
4.0000 mg | Freq: Once | INTRAMUSCULAR | Status: AC
  Administered 2017-06-16: 4 mg via INTRAVENOUS

## 2017-06-16 MED ORDER — SUCCINYLCHOLINE CHLORIDE 200 MG/10ML IV SOSY
PREFILLED_SYRINGE | INTRAVENOUS | Status: DC | PRN
  Administered 2017-06-16: 100 mg via INTRAVENOUS

## 2017-06-16 MED ORDER — KETOROLAC TROMETHAMINE 30 MG/ML IJ SOLN
30.0000 mg | Freq: Once | INTRAMUSCULAR | Status: AC
  Administered 2017-06-16: 30 mg via INTRAVENOUS

## 2017-06-16 NOTE — Anesthesia Preprocedure Evaluation (Signed)
Anesthesia Evaluation  Patient identified by MRN, date of birth, ID band Patient awake    Reviewed: Allergy & Precautions, H&P , NPO status , Patient's Chart, lab work & pertinent test results, reviewed documented beta blocker date and time   Airway Mallampati: II   Neck ROM: full    Dental  (+) Teeth Intact   Pulmonary neg pulmonary ROS,    Pulmonary exam normal        Cardiovascular negative cardio ROS Normal cardiovascular exam Rhythm:regular Rate:Normal     Neuro/Psych negative neurological ROS  negative psych ROS   GI/Hepatic negative GI ROS, Neg liver ROS,   Endo/Other  negative endocrine ROS  Renal/GU negative Renal ROS  negative genitourinary   Musculoskeletal   Abdominal   Peds  Hematology negative hematology ROS (+)   Anesthesia Other Findings Past Medical History: No date: Anxiety No date: Arthritis     Comment:  soriatic No date: Bipolar affective disorder, manic, mild (HCC) No date: Depression No date: Migraine headache No date: PTSD (post-traumatic stress disorder) Past Surgical History: No date: CESAREAN SECTION No date: CHOLECYSTECTOMY BMI    Body Mass Index:  26.98 kg/m     Reproductive/Obstetrics negative OB ROS                             Anesthesia Physical Anesthesia Plan  ASA: III  Anesthesia Plan: General   Post-op Pain Management:    Induction:   PONV Risk Score and Plan:   Airway Management Planned:   Additional Equipment:   Intra-op Plan:   Post-operative Plan:   Informed Consent: I have reviewed the patients History and Physical, chart, labs and discussed the procedure including the risks, benefits and alternatives for the proposed anesthesia with the patient or authorized representative who has indicated his/her understanding and acceptance.   Dental Advisory Given  Plan Discussed with: CRNA  Anesthesia Plan Comments:          Anesthesia Quick Evaluation

## 2017-06-16 NOTE — Procedures (Signed)
ECT SERVICES Physician's Interval Evaluation & Treatment Note  Patient Identification: Katelyn ChurchChrista [name] MRN:  [id number] Date of Evaluation:  06/16/2017 TX #: 3  MADRS:   MMSE:   P.E. Findings:  No change to physical exam.  Vitals normal heart and lungs normal.  Psychiatric Interval Note:  No change to complaint  Subjective:  Patient is a 47 y.o. female seen for evaluation for Electroconvulsive Therapy. Subjective depression  Treatment Summary:   [x]   Right Unilateral             []  Bilateral   % Energy : 0.3 ms, 100%   Impedance: 2010 ohms  Seizure Energy Index: 14,331 V squared  Postictal Suppression Index: 43%  Seizure Concordance Index: 98%  Medications  Pre Shock: Toradol 30 mg Zofran 4 mg ketamine 100 mg succinylcholine 100 mg  Post Shock:    Seizure Duration: 12 seconds by EMG 26 seconds by EEG   Comments: Patient had a much more adequate seizure today.  We will continue off of the Depakote and minimizing Xanax and using ketamine for anesthetic.  Patient is on the schedule Monday Wednesday and Friday next week  Lungs:  [x]   Clear to auscultation               []  Other:   Heart:    [x]   Regular rhythm             []  irregular rhythm    [x]   Previous H&P reviewed, patient examined and there are NO CHANGES                 []   Previous H&P reviewed, patient examined and there are changes noted.   Katelyn RasmussenJohn Srah Ake, MD 2/22/201910:20 AM

## 2017-06-16 NOTE — H&P (Signed)
[name] is an 47 y.o. female.   Chief Complaint: Patient with major depression as part of bipolar disorder which has been going on for quite a while without response to medicine.  No change to complaint today.  Less myalgia HPI: History of bipolar disorder with only partial stabilization currently with severe depression  Past Medical History:  Diagnosis Date  . Anxiety   . Arthritis    soriatic  . Bipolar affective disorder, manic, mild (HCC)   . Depression   . Migraine headache   . PTSD (post-traumatic stress disorder)     Past Surgical History:  Procedure Laterality Date  . CESAREAN SECTION    . CHOLECYSTECTOMY      Family History  Problem Relation Age of Onset  . Alcohol abuse Sister   . Depression Sister   . Alcohol abuse Maternal Grandfather   . Multiple sclerosis Mother    Social History:  reports that  has never smoked. she has never used smokeless tobacco. She reports that she drinks alcohol. She reports that she does not use drugs.  Allergies:  Allergies  Allergen Reactions  . Prednisone Other (See Comments)    Spikes mania episodes     (Not in a hospital admission)  No results found for this or any previous visit (from the past 48 hour(s)). No results found.  Review of Systems  Constitutional: Negative.   HENT: Negative.   Eyes: Negative.   Respiratory: Negative.   Cardiovascular: Negative.   Gastrointestinal: Negative.   Musculoskeletal: Negative.   Skin: Negative.   Neurological: Negative.   Psychiatric/Behavioral: Positive for depression. Negative for hallucinations, memory loss, substance abuse and suicidal ideas. The patient is not nervous/anxious and does not have insomnia.     Blood pressure 132/79, pulse 81, temperature 98.9 F (37.2 C), temperature source Oral, resp. rate 16, height 5\' 2"  (1.575 m), weight 66.9 kg (147 lb 8 oz), SpO2 100 %. Physical Exam  Nursing note and vitals reviewed. Constitutional: She appears  well-developed and well-nourished.  HENT:  Head: Normocephalic and atraumatic.  Eyes: Conjunctivae are normal. Pupils are equal, round, and reactive to light.  Neck: Normal range of motion.  Cardiovascular: Normal heart sounds.  Respiratory: Effort normal.  GI: Soft.  Musculoskeletal: Normal range of motion.  Neurological: She is alert.  Skin: Skin is warm and dry.  Psychiatric: Judgment normal. Her affect is blunt. Her speech is delayed. She is slowed. Cognition and memory are normal. She expresses no suicidal ideation.     Assessment/Plan Treatment #3 today with a switch to ketamine anesthetic  Mordecai RasmussenJohn Tomaz Janis, MD 06/16/2017, 10:19 AM

## 2017-06-16 NOTE — Anesthesia Post-op Follow-up Note (Signed)
Anesthesia QCDR form completed.        

## 2017-06-16 NOTE — Progress Notes (Signed)
This nurse asked the patient if she was able to eat dinner after her procedure. She stated that she does not usually. She stated she just rests for the evening. This nurse sent her home with a pack of peanut butter crackers, a pack of cookies and an container of applesauce. The patient was instructed to leave the food at the bedside and eat the food tonight so as to have snacks for the patient to consume. The patient verbalized understanding.

## 2017-06-16 NOTE — Transfer of Care (Signed)
Immediate Anesthesia Transfer of Care Note  Patient: Katelyn [name]  Procedure(s) Performed: ECT TX  Patient Location: PACU  Anesthesia Type:General  Level of Consciousness: sedated  Airway & Oxygen Therapy: Patient Spontanous Breathing and Patient connected to face mask oxygen  Post-op Assessment: Report given to RN and Post -op Vital signs reviewed and stable  Post vital signs: Reviewed and stable  Last Vitals:  Vitals:   06/16/17 0859 06/16/17 1039  BP: 132/79 (!) 112/52  Pulse: 81 96  Resp: 16 14  Temp: 37.2 C 37.4 C  SpO2: 100% 99%    Last Pain:  Vitals:   06/16/17 1039  TempSrc:   PainSc: Asleep         Complications: No apparent anesthesia complications

## 2017-06-16 NOTE — Anesthesia Procedure Notes (Signed)
Date/Time: 06/16/2017 10:28 AM Performed by: Lily KocherPeralta, Travus Oren, CRNA Pre-anesthesia Checklist: Patient identified, Emergency Drugs available, Suction available and Patient being monitored Patient Re-evaluated:Patient Re-evaluated prior to induction Oxygen Delivery Method: Circle system utilized Preoxygenation: Pre-oxygenation with 100% oxygen Induction Type: IV induction Ventilation: Mask ventilation without difficulty and Mask ventilation throughout procedure Airway Equipment and Method: Bite block Placement Confirmation: positive ETCO2 Dental Injury: Teeth and Oropharynx as per pre-operative assessment

## 2017-06-16 NOTE — Discharge Instructions (Signed)
1)  The drugs that you have been given will stay in your system until tomorrow so for the       next 24 hours you should not:  A. Drive an automobile  B. Make any legal decisions  C. Drink any alcoholic beverages  2)  You may resume your regular meals upon return home.  3)  A responsible adult must take you home.  Someone should stay with you for a few          hours, then be available by phone for the remainder of the treatment day.  4)  You May experience any of the following symptoms:  Headache, Nausea and a dry mouth (due to the medications you were given),  temporary memory loss and some confusion, or sore muscles (a warm bath  should help this).  If you you experience any of these symptoms let us know on                your return visit.  5)  Report any of the following: any acute discomfort, severe headache, or temperature        greater than 100.5 F.   Also report any unusual redness, swelling, drainage, or pain         at your IV site.    You may report Symptoms to:  ECT PROGRAM- [place] [facility]          Phone: [phone], ECT Department           or Dr. Shary Keylapac's office [phone]  6)  Your next ECT Treatment is Monday February 25 at 8:30   We will call 2 days prior to your scheduled appointment for arrival times.  7)  Nothing to eat or drink after midnight the night before your procedure.  8)  Take      With a sip of water the morning of your procedure.  9)  Other Instructions: Call [phone] to cancel the morning of your procedure due         to illness or emergency.  10) We will call within 72 hours to assess how you are feeling.

## 2017-06-19 ENCOUNTER — Encounter (HOSPITAL_BASED_OUTPATIENT_CLINIC_OR_DEPARTMENT_OTHER)
Admission: RE | Admit: 2017-06-19 | Discharge: 2017-06-19 | Disposition: A | Discharge status: Home / Self Care | Payer: BC Managed Care – PPO | Source: Ambulatory Visit | Attending: Psychiatry | Admitting: Psychiatry

## 2017-06-19 ENCOUNTER — Other Ambulatory Visit: Payer: Self-pay | Admitting: Psychiatry

## 2017-06-19 ENCOUNTER — Encounter: Payer: Self-pay | Admitting: Anesthesiology

## 2017-06-19 DIAGNOSIS — F332 Major depressive disorder, recurrent severe without psychotic features: Secondary | ICD-10-CM | POA: Diagnosis not present

## 2017-06-19 DIAGNOSIS — F3111 Bipolar disorder, current episode manic without psychotic features, mild: Secondary | ICD-10-CM | POA: Diagnosis not present

## 2017-06-19 DIAGNOSIS — F3130 Bipolar disorder, current episode depressed, mild or moderate severity, unspecified: Secondary | ICD-10-CM

## 2017-06-19 MED ORDER — SODIUM CHLORIDE 0.9 % IV SOLN
500.0000 mL | Freq: Once | INTRAVENOUS | Status: AC
  Administered 2017-06-19: 500 mL via INTRAVENOUS

## 2017-06-19 MED ORDER — SUCCINYLCHOLINE CHLORIDE 200 MG/10ML IV SOSY
PREFILLED_SYRINGE | INTRAVENOUS | Status: DC | PRN
  Administered 2017-06-19: 100 mg via INTRAVENOUS

## 2017-06-19 MED ORDER — SODIUM CHLORIDE 0.9 % IV SOLN
INTRAVENOUS | Status: DC | PRN
  Administered 2017-06-19: 09:00:00 via INTRAVENOUS

## 2017-06-19 MED ORDER — ONDANSETRON HCL 4 MG/2ML IJ SOLN
4.0000 mg | Freq: Once | INTRAMUSCULAR | Status: AC
  Administered 2017-06-19: 4 mg via INTRAVENOUS

## 2017-06-19 MED ORDER — KETAMINE HCL 50 MG/ML IJ SOLN
INTRAMUSCULAR | Status: AC
  Filled 2017-06-19: qty 10

## 2017-06-19 MED ORDER — KETOROLAC TROMETHAMINE 30 MG/ML IJ SOLN
30.0000 mg | Freq: Once | INTRAMUSCULAR | Status: AC
  Administered 2017-06-19: 30 mg via INTRAVENOUS

## 2017-06-19 MED ORDER — KETAMINE HCL 10 MG/ML IJ SOLN
INTRAMUSCULAR | Status: DC | PRN
  Administered 2017-06-19: 100 mg via INTRAVENOUS

## 2017-06-19 MED ORDER — ONDANSETRON HCL 4 MG/2ML IJ SOLN
INTRAMUSCULAR | Status: AC
  Administered 2017-06-19: 4 mg via INTRAVENOUS
  Filled 2017-06-19: qty 2

## 2017-06-19 MED ORDER — KETOROLAC TROMETHAMINE 30 MG/ML IJ SOLN
INTRAMUSCULAR | Status: AC
  Administered 2017-06-19: 30 mg via INTRAVENOUS
  Filled 2017-06-19: qty 1

## 2017-06-19 NOTE — Anesthesia Procedure Notes (Signed)
Date/Time: 06/19/2017 10:56 AM Performed by: Lily KocherPeralta, Anusha Claus, CRNA Pre-anesthesia Checklist: Patient identified, Emergency Drugs available, Suction available and Patient being monitored Patient Re-evaluated:Patient Re-evaluated prior to induction Oxygen Delivery Method: Circle system utilized Preoxygenation: Pre-oxygenation with 100% oxygen Induction Type: IV induction Ventilation: Mask ventilation without difficulty and Mask ventilation throughout procedure Airway Equipment and Method: Bite block Placement Confirmation: positive ETCO2 Dental Injury: Teeth and Oropharynx as per pre-operative assessment

## 2017-06-19 NOTE — Anesthesia Postprocedure Evaluation (Signed)
Anesthesia Post Note  Patient: Katelyn [name]  Procedure(s) Performed: ECT TX  Patient location during evaluation: PACU Anesthesia Type: General Level of consciousness: awake and alert Pain management: pain level controlled Vital Signs Assessment: post-procedure vital signs reviewed and stable Respiratory status: spontaneous breathing and respiratory function stable Cardiovascular status: stable Anesthetic complications: no     Last Vitals:  Vitals:   06/19/17 1115 06/19/17 1120  BP: 113/68   Pulse: 87   Resp: 16   Temp:    SpO2: 100% 100%    Last Pain:  Vitals:   06/19/17 1105  TempSrc:   PainSc: 0-No pain                 KEPHART,WILLIAM K

## 2017-06-19 NOTE — Procedures (Signed)
ECT SERVICES Physician's Interval Evaluation & Treatment Note  Patient Identification: Katelyn [name] MRN:  [id number] Date of Evaluation:  06/19/2017 TX #: 4  MADRS: 18  MMSE: 30  P.E. Findings:  No change physical exam  Psychiatric Interval Note:  Mood slightly better  Subjective:  Patient is a 47 y.o. female seen for evaluation for Electroconvulsive Therapy. Subjective aches and pains over the weekend  Treatment Summary:   [x]   Right Unilateral             []  Bilateral   % Energy : 0.3 ms 100%   Impedance: 1810 ohms  Seizure Energy Index: 17,093 V squared  Postictal Suppression Index: 45%  Seizure Concordance Index: 96%  Medications  Pre Shock: Toradol 30 mg Zofran 4 mg ketamine 100 mg succinylcholine 100 mg  Post Shock:    Seizure Duration: 13 seconds by EMG 24 seconds by EEG   Comments: Tolerating treatment doing pretty well with it Next treatment Wednesday  Lungs:  [x]   Clear to auscultation               []  Other:   Heart:    [x]   Regular rhythm             []  irregular rhythm    [x]   Previous H&P reviewed, patient examined and there are NO CHANGES                 []   Previous H&P reviewed, patient examined and there are changes noted.   Mordecai RasmussenJohn Seda Kronberg, MD 2/25/201910:47 AM

## 2017-06-19 NOTE — Discharge Instructions (Signed)
1)  The drugs that you have been given will stay in your system until tomorrow so for the       next 24 hours you should not:  A. Drive an automobile  B. Make any legal decisions  C. Drink any alcoholic beverages  2)  You may resume your regular meals upon return home.  3)  A responsible adult must take you home.  Someone should stay with you for a few          hours, then be available by phone for the remainder of the treatment day.  4)  You May experience any of the following symptoms:  Headache, Nausea and a dry mouth (due to the medications you were given),  temporary memory loss and some confusion, or sore muscles (a warm bath  should help this).  If you you experience any of these symptoms let us know on                your return visit.  5)  Report any of the following: any acute discomfort, severe headache, or temperature        greater than 100.5 F.   Also report any unusual redness, swelling, drainage, or pain         at your IV site.    You may report Symptoms to:  ECT PROGRAM- [place] [facility]          Phone: [phone], ECT Department           or Dr. Shary Keylapac's office [phone]  6)  Your next ECT Treatment is Wednesday 0800  We will call 2 days prior to your scheduled appointment for arrival times.  7)  Nothing to eat or drink after midnight the night before your procedure.  8)  Take     With a sip of water the morning of your procedure.  9)  Other Instructions: Call [phone] to cancel the morning of your procedure due         to illness or emergency.  10) We will call within 72 hours to assess how you are feeling.

## 2017-06-19 NOTE — Anesthesia Postprocedure Evaluation (Signed)
Anesthesia Post Note  Patient: Berdie Trembath  Procedure(s) Performed: ECT TX  Patient location during evaluation: PACU Anesthesia Type: General Level of consciousness: awake and alert Pain management: pain level controlled Vital Signs Assessment: post-procedure vital signs reviewed and stable Respiratory status: spontaneous breathing, nonlabored ventilation, respiratory function stable and patient connected to nasal cannula oxygen Cardiovascular status: blood pressure returned to baseline and stable Postop Assessment: no apparent nausea or vomiting Anesthetic complications: no     Last Vitals:  Vitals:   06/16/17 1124 06/16/17 1126  BP:  (!) 108/43  Pulse: 78   Resp: 16   Temp: 37.1 C   SpO2:      Last Pain:  Vitals:   06/16/17 1124  TempSrc: Oral  PainSc: 3                  Yevette EdwardsJames G Adams

## 2017-06-19 NOTE — Anesthesia Post-op Follow-up Note (Signed)
Anesthesia QCDR form completed.        

## 2017-06-19 NOTE — Anesthesia Preprocedure Evaluation (Signed)
Anesthesia Evaluation  Patient identified by MRN, date of birth, ID band Patient awake    Reviewed: Allergy & Precautions, H&P , NPO status , Patient's Chart, lab work & pertinent test results  Airway Mallampati: II   Neck ROM: full    Dental  (+) Teeth Intact   Pulmonary neg pulmonary ROS,    Pulmonary exam normal        Cardiovascular METS(-) Past MI and (-) CHF Normal cardiovascular exam(-) dysrhythmias (-) Valvular Problems/Murmurs Rhythm:regular Rate:Normal     Neuro/Psych neg Seizures Anxiety Depression Bipolar Disorder negative psych ROS   GI/Hepatic Neg liver ROS, neg GERD  ,  Endo/Other  neg diabetes  Renal/GU negative Renal ROS  negative genitourinary   Musculoskeletal   Abdominal   Peds  Hematology negative hematology ROS (+)   Anesthesia Other Findings Past Medical History: No date: Anxiety No date: Arthritis     Comment:  soriatic No date: Bipolar affective disorder, manic, mild (HCC) No date: Depression No date: Migraine headache No date: PTSD (post-traumatic stress disorder) Past Surgical History: No date: CESAREAN SECTION No date: CHOLECYSTECTOMY BMI    Body Mass Index:  26.98 kg/m     Reproductive/Obstetrics negative OB ROS                             Anesthesia Physical  Anesthesia Plan  ASA: III  Anesthesia Plan: General   Post-op Pain Management:    Induction:   PONV Risk Score and Plan:   Airway Management Planned:   Additional Equipment:   Intra-op Plan:   Post-operative Plan:   Informed Consent: I have reviewed the patients History and Physical, chart, labs and discussed the procedure including the risks, benefits and alternatives for the proposed anesthesia with the patient or authorized representative who has indicated his/her understanding and acceptance.     Plan Discussed with: CRNA  Anesthesia Plan Comments:          Anesthesia Quick Evaluation  

## 2017-06-19 NOTE — Transfer of Care (Signed)
Immediate Anesthesia Transfer of Care Note  Patient: Katelyn [name]  Procedure(s) Performed: ECT TX  Patient Location: PACU  Anesthesia Type:General  Level of Consciousness: sedated  Airway & Oxygen Therapy: Patient Spontanous Breathing and Patient connected to face mask oxygen  Post-op Assessment: Report given to RN and Post -op Vital signs reviewed and stable  Post vital signs: Reviewed and stable  Last Vitals:  Vitals:   06/19/17 0849 06/19/17 1105  BP: (!) 96/47 125/70  Pulse: 73 91  Resp: 16 10  Temp: 37.3 C 37.4 C  SpO2: 97% 99%    Last Pain:  Vitals:   06/19/17 1105  TempSrc:   PainSc: 0-No pain         Complications: No apparent anesthesia complications

## 2017-06-19 NOTE — H&P (Signed)
Katelyn [name] is an 47 y.o. female.   Chief Complaint: Patient denies suicidal ideation.  Mood felt slightly better this weekend. HPI: History of recurrent severe depression as part of bipolar disorder  Past Medical History:  Diagnosis Date  . Anxiety   . Arthritis    soriatic  . Bipolar affective disorder, manic, mild (HCC)   . Depression   . Migraine headache   . PTSD (post-traumatic stress disorder)     Past Surgical History:  Procedure Laterality Date  . CESAREAN SECTION    . CHOLECYSTECTOMY      Family History  Problem Relation Age of Onset  . Alcohol abuse Sister   . Depression Sister   . Alcohol abuse Maternal Grandfather   . Multiple sclerosis Mother    Social History:  reports that  has never smoked. she has never used smokeless tobacco. She reports that she drinks alcohol. She reports that she does not use drugs.  Allergies:  Allergies  Allergen Reactions  . Prednisone Other (See Comments)    Spikes mania episodes     (Not in a hospital admission)  No results found for this or any previous visit (from the past 48 hour(s)). No results found.  Review of Systems  Constitutional: Negative.   HENT: Negative.   Eyes: Negative.   Respiratory: Negative.   Cardiovascular: Negative.   Gastrointestinal: Negative.   Musculoskeletal: Positive for myalgias.  Skin: Negative.   Neurological: Negative.   Psychiatric/Behavioral: Positive for depression. Negative for hallucinations, memory loss, substance abuse and suicidal ideas. The patient is not nervous/anxious and does not have insomnia.     Blood pressure (!) 96/47, pulse 73, temperature 99.2 F (37.3 C), temperature source Oral, resp. rate 16, height 5\' 2"  (1.575 m), weight 66.7 kg (147 lb), last menstrual period 06/05/2017, SpO2 97 %. Physical Exam  Nursing note and vitals reviewed. Constitutional: She appears well-developed and well-nourished.  HENT:  Head: Normocephalic and atraumatic.  Eyes:  Conjunctivae are normal. Pupils are equal, round, and reactive to light.  Neck: Normal range of motion.  Cardiovascular: Regular rhythm and normal heart sounds.  Respiratory: Effort normal. No respiratory distress.  GI: Soft.  Musculoskeletal: Normal range of motion.  Neurological: She is alert.  Skin: Skin is warm and dry.  Psychiatric: Her speech is normal and behavior is normal. Judgment and thought content normal. Cognition and memory are normal. She exhibits a depressed mood.     Assessment/Plan Continue right unilateral ECT with ketamine anesthetic for now  Katelyn RasmussenJohn Ayasha Ellingsen, MD 06/19/2017, 10:46 AM

## 2017-06-20 ENCOUNTER — Other Ambulatory Visit: Payer: Self-pay | Admitting: Psychiatry

## 2017-06-21 ENCOUNTER — Encounter: Payer: Self-pay | Admitting: Anesthesiology

## 2017-06-21 ENCOUNTER — Ambulatory Visit
Admission: RE | Admit: 2017-06-21 | Discharge: 2017-06-21 | Disposition: A | Discharge status: Home / Self Care | Payer: BC Managed Care – PPO | Source: Ambulatory Visit | Attending: Psychiatry | Admitting: Psychiatry

## 2017-06-21 DIAGNOSIS — L4050 Arthropathic psoriasis, unspecified: Secondary | ICD-10-CM | POA: Insufficient documentation

## 2017-06-21 DIAGNOSIS — F3130 Bipolar disorder, current episode depressed, mild or moderate severity, unspecified: Secondary | ICD-10-CM

## 2017-06-21 DIAGNOSIS — Z818 Family history of other mental and behavioral disorders: Secondary | ICD-10-CM | POA: Insufficient documentation

## 2017-06-21 DIAGNOSIS — F4310 Post-traumatic stress disorder, unspecified: Secondary | ICD-10-CM | POA: Diagnosis not present

## 2017-06-21 DIAGNOSIS — Z888 Allergy status to other drugs, medicaments and biological substances status: Secondary | ICD-10-CM | POA: Diagnosis not present

## 2017-06-21 DIAGNOSIS — F419 Anxiety disorder, unspecified: Secondary | ICD-10-CM | POA: Insufficient documentation

## 2017-06-21 MED ORDER — ONDANSETRON HCL 4 MG/2ML IJ SOLN
4.0000 mg | Freq: Once | INTRAMUSCULAR | Status: AC
  Administered 2017-06-21: 4 mg via INTRAVENOUS

## 2017-06-21 MED ORDER — KETOROLAC TROMETHAMINE 30 MG/ML IJ SOLN
30.0000 mg | Freq: Once | INTRAMUSCULAR | Status: AC
  Administered 2017-06-21: 30 mg via INTRAVENOUS

## 2017-06-21 MED ORDER — SODIUM CHLORIDE 0.9 % IV SOLN
INTRAVENOUS | Status: DC | PRN
  Administered 2017-06-21: 09:00:00 via INTRAVENOUS

## 2017-06-21 MED ORDER — SODIUM CHLORIDE 0.9 % IV SOLN
500.0000 mL | Freq: Once | INTRAVENOUS | Status: AC
  Administered 2017-06-21: 500 mL via INTRAVENOUS

## 2017-06-21 MED ORDER — KETAMINE HCL 10 MG/ML IJ SOLN
INTRAMUSCULAR | Status: DC | PRN
  Administered 2017-06-21: 100 mg via INTRAVENOUS

## 2017-06-21 MED ORDER — SUCCINYLCHOLINE CHLORIDE 200 MG/10ML IV SOSY
PREFILLED_SYRINGE | INTRAVENOUS | Status: DC | PRN
  Administered 2017-06-21: 100 mg via INTRAVENOUS

## 2017-06-21 NOTE — Anesthesia Preprocedure Evaluation (Signed)
Anesthesia Evaluation  Patient identified by MRN, date of birth, ID band Patient awake    Reviewed: Allergy & Precautions, H&P , NPO status , Patient's Chart, lab work & pertinent test results  Airway Mallampati: II   Neck ROM: full    Dental  (+) Teeth Intact   Pulmonary neg pulmonary ROS,    Pulmonary exam normal        Cardiovascular METS(-) Past MI and (-) CHF Normal cardiovascular exam(-) dysrhythmias (-) Valvular Problems/Murmurs Rhythm:regular Rate:Normal     Neuro/Psych neg Seizures Anxiety Depression Bipolar Disorder negative psych ROS   GI/Hepatic Neg liver ROS, neg GERD  ,  Endo/Other  neg diabetes  Renal/GU negative Renal ROS  negative genitourinary   Musculoskeletal   Abdominal   Peds  Hematology negative hematology ROS (+)   Anesthesia Other Findings Past Medical History: No date: Anxiety No date: Arthritis     Comment:  soriatic No date: Bipolar affective disorder, manic, mild (HCC) No date: Depression No date: Migraine headache No date: PTSD (post-traumatic stress disorder) Past Surgical History: No date: CESAREAN SECTION No date: CHOLECYSTECTOMY BMI    Body Mass Index:  26.98 kg/m     Reproductive/Obstetrics negative OB ROS                             Anesthesia Physical  Anesthesia Plan  ASA: III  Anesthesia Plan: General   Post-op Pain Management:    Induction:   PONV Risk Score and Plan:   Airway Management Planned:   Additional Equipment:   Intra-op Plan:   Post-operative Plan:   Informed Consent: I have reviewed the patients History and Physical, chart, labs and discussed the procedure including the risks, benefits and alternatives for the proposed anesthesia with the patient or authorized representative who has indicated his/her understanding and acceptance.     Plan Discussed with: CRNA  Anesthesia Plan Comments:          Anesthesia Quick Evaluation

## 2017-06-21 NOTE — Anesthesia Post-op Follow-up Note (Signed)
Anesthesia QCDR form completed.        

## 2017-06-21 NOTE — Transfer of Care (Signed)
Immediate Anesthesia Transfer of Care Note  Patient: Katelyn [name]  Procedure(s) Performed: ECT TX  Patient Location: PACU  Anesthesia Type:General  Level of Consciousness: sedated  Airway & Oxygen Therapy: Patient Spontanous Breathing and Patient connected to face mask oxygen  Post-op Assessment: Report given to RN and Post -op Vital signs reviewed and stable  Post vital signs: Reviewed and stable  Last Vitals:  Vitals:   06/21/17 0830 06/21/17 1036  BP: (!) 127/46 117/61  Pulse:  89  Resp:  19  Temp:  37.4 C  SpO2:  99%    Last Pain:  Vitals:   06/21/17 1036  TempSrc:   PainSc: Asleep         Complications: No apparent anesthesia complications

## 2017-06-21 NOTE — Discharge Instructions (Signed)
1)  The drugs that you have been given will stay in your system until tomorrow so for the       next 24 hours you should not:  A. Drive an automobile  B. Make any legal decisions  C. Drink any alcoholic beverages  2)  You may resume your regular meals upon return home.  3)  A responsible adult must take you home.  Someone should stay with you for a few          hours, then be available by phone for the remainder of the treatment day.  4)  You May experience any of the following symptoms:  Headache, Nausea and a dry mouth (due to the medications you were given),  temporary memory loss and some confusion, or sore muscles (a warm bath  should help this).  If you you experience any of these symptoms let us know on                your return visit.  5)  Report any of the following: any acute discomfort, severe headache, or temperature        greater than 100.5 F.   Also report any unusual redness, swelling, drainage, or pain         at your IV site.    You may report Symptoms to:  ECT PROGRAM- [place] [facility]          Phone: [phone], ECT Department           or Dr. Shary Keylapac's office [phone]  6)  Your next ECT Treatment is Day Friday  Date June 23, 2017  We will call 2 days prior to your scheduled appointment for arrival times.  7)  Nothing to eat or drink after midnight the night before your procedure.  8)  Take      With a sip of water the morning of your procedure.  9)  Other Instructions: Call [phone] to cancel the morning of your procedure due         to illness or emergency.  10) We will call within 72 hours to assess how you are feeling.

## 2017-06-21 NOTE — Anesthesia Procedure Notes (Signed)
Date/Time: 06/21/2017 10:26 AM Performed by: Lily KocherPeralta, Deloise Marchant, CRNA Pre-anesthesia Checklist: Patient identified, Emergency Drugs available, Suction available and Patient being monitored Patient Re-evaluated:Patient Re-evaluated prior to induction Oxygen Delivery Method: Circle system utilized Preoxygenation: Pre-oxygenation with 100% oxygen Induction Type: IV induction Ventilation: Mask ventilation without difficulty and Mask ventilation throughout procedure Airway Equipment and Method: Bite block Placement Confirmation: positive ETCO2 Dental Injury: Teeth and Oropharynx as per pre-operative assessment

## 2017-06-21 NOTE — H&P (Signed)
Katelyn ChurchChrista [name] is an 47 y.o. female.   Chief Complaint: Patient's mood is gradually improving.  No physical complaints.  Mild soreness HPI: History of severe depression as part of bipolar disorder  Past Medical History:  Diagnosis Date  . Anxiety   . Arthritis    soriatic  . Bipolar affective disorder, manic, mild (HCC)   . Depression   . Migraine headache   . PTSD (post-traumatic stress disorder)     Past Surgical History:  Procedure Laterality Date  . CESAREAN SECTION    . CHOLECYSTECTOMY      Family History  Problem Relation Age of Onset  . Alcohol abuse Sister   . Depression Sister   . Alcohol abuse Maternal Grandfather   . Multiple sclerosis Mother    Social History:  reports that  has never smoked. she has never used smokeless tobacco. She reports that she drinks alcohol. She reports that she does not use drugs.  Allergies:  Allergies  Allergen Reactions  . Prednisone Other (See Comments)    Spikes mania episodes     (Not in a hospital admission)  No results found for this or any previous visit (from the past 48 hour(s)). No results found.  Review of Systems  Constitutional: Negative.   HENT: Negative.   Eyes: Negative.   Respiratory: Negative.   Cardiovascular: Negative.   Gastrointestinal: Negative.   Musculoskeletal: Negative.   Skin: Negative.   Neurological: Negative.   Psychiatric/Behavioral: Positive for depression. Negative for hallucinations, memory loss, substance abuse and suicidal ideas. The patient is not nervous/anxious and does not have insomnia.     Blood pressure (!) 127/46, pulse 78, temperature 98.8 F (37.1 C), temperature source Oral, resp. rate 18, height 5\' 2"  (1.575 m), weight 66.2 kg (146 lb), last menstrual period 06/05/2017. Physical Exam  Nursing note and vitals reviewed. Constitutional: She appears well-developed and well-nourished.  HENT:  Head: Normocephalic and atraumatic.  Eyes: Conjunctivae are normal. Pupils  are equal, round, and reactive to light.  Neck: Normal range of motion.  Cardiovascular: Regular rhythm and normal heart sounds.  Respiratory: Effort normal. No respiratory distress.  GI: Soft.  Musculoskeletal: Normal range of motion.  Neurological: She is alert.  Skin: Skin is warm and dry.  Psychiatric: Her speech is normal and behavior is normal. Judgment and thought content normal. Cognition and memory are normal. She exhibits a depressed mood.     Assessment/Plan Looking better without any sign of oncoming me at this point.  Today is only treatment #5 with the first couple of them probably being ineffective.  Still on the scheduled for Friday and continue reassessment for need daily.  Mordecai RasmussenJohn Sua Spadafora, MD 06/21/2017, 10:18 AM

## 2017-06-21 NOTE — Anesthesia Postprocedure Evaluation (Signed)
Anesthesia Post Note  Patient: Katelyn [name]  Procedure(s) Performed: ECT TX  Patient location during evaluation: PACU Anesthesia Type: General Level of consciousness: awake and alert Pain management: pain level controlled Vital Signs Assessment: post-procedure vital signs reviewed and stable Respiratory status: spontaneous breathing, nonlabored ventilation, respiratory function stable and patient connected to nasal cannula oxygen Cardiovascular status: blood pressure returned to baseline and stable Postop Assessment: no apparent nausea or vomiting Anesthetic complications: no     Last Vitals:  Vitals:   06/21/17 1058 06/21/17 1109  BP:  (!) 118/51  Pulse: 80 81  Resp: 11 18  Temp: 36.7 C   SpO2: 98%     Last Pain:  Vitals:   06/21/17 1058  TempSrc:   PainSc: 0-No pain                 Lenard SimmerAndrew Bessie Livingood

## 2017-06-21 NOTE — Procedures (Signed)
ECT SERVICES Physician's Interval Evaluation & Treatment Note  Patient Identification: Katelyn ChurchChrista [name] MRN:  [id number] Date of Evaluation:  06/21/2017 TX #: 5  MADRS:   MMSE:   P.E. Findings:  No change to physical exam  Psychiatric Interval Note:  Affect is brighter.  Denies any memory impairment.  Subjective:  Patient is a 47 y.o. female seen for evaluation for Electroconvulsive Therapy. No complaint  Treatment Summary:   [x]   Right Unilateral             []  Bilateral   % Energy : 0.3 ms 100%   Impedance: 1990 ohms  Seizure Energy Index: 10,991 squared  Postictal Suppression Index: 94%  Seizure Concordance Index: 94%  Medications  Pre Shock: Toradol 30 mg Zofran 4 mg ketamine 100 mg sucks choline 100 mg  Post Shock:    Seizure Duration: 9 seconds by EMG 23 seconds by EEG   Comments: Return on Friday  Lungs:  [x]   Clear to auscultation               []  Other:   Heart:    [x]   Regular rhythm             []  irregular rhythm    [x]   Previous H&P reviewed, patient examined and there are NO CHANGES                 []   Previous H&P reviewed, patient examined and there are changes noted.   Mordecai RasmussenJohn Clapacs, MD 2/27/201910:20 AM

## 2017-06-23 ENCOUNTER — Telehealth (HOSPITAL_COMMUNITY): Payer: Self-pay | Admitting: *Deleted

## 2017-06-23 ENCOUNTER — Encounter: Payer: Self-pay | Admitting: Registered Nurse

## 2017-06-23 ENCOUNTER — Encounter
Admission: RE | Admit: 2017-06-23 | Discharge: 2017-06-23 | Disposition: A | Discharge status: Home / Self Care | Payer: BC Managed Care – PPO | Source: Ambulatory Visit | Attending: Psychiatry | Admitting: Psychiatry

## 2017-06-23 DIAGNOSIS — F419 Anxiety disorder, unspecified: Secondary | ICD-10-CM | POA: Insufficient documentation

## 2017-06-23 DIAGNOSIS — G43909 Migraine, unspecified, not intractable, without status migrainosus: Secondary | ICD-10-CM | POA: Diagnosis not present

## 2017-06-23 DIAGNOSIS — F4310 Post-traumatic stress disorder, unspecified: Secondary | ICD-10-CM | POA: Diagnosis not present

## 2017-06-23 DIAGNOSIS — F3130 Bipolar disorder, current episode depressed, mild or moderate severity, unspecified: Secondary | ICD-10-CM

## 2017-06-23 DIAGNOSIS — F315 Bipolar disorder, current episode depressed, severe, with psychotic features: Secondary | ICD-10-CM | POA: Insufficient documentation

## 2017-06-23 LAB — POCT PREGNANCY, URINE: PREG TEST UR: NEGATIVE

## 2017-06-23 MED ORDER — SODIUM CHLORIDE 0.9 % IV SOLN
INTRAVENOUS | Status: DC | PRN
  Administered 2017-06-23: 11:00:00 via INTRAVENOUS

## 2017-06-23 MED ORDER — LIDOCAINE 2% (20 MG/ML) 5 ML SYRINGE
INTRAMUSCULAR | Status: DC | PRN
  Administered 2017-06-23: 100 mg via INTRAVENOUS

## 2017-06-23 MED ORDER — KETAMINE HCL 50 MG/ML IJ SOLN
INTRAMUSCULAR | Status: AC
  Filled 2017-06-23: qty 10

## 2017-06-23 MED ORDER — KETOROLAC TROMETHAMINE 30 MG/ML IJ SOLN
INTRAMUSCULAR | Status: AC
  Administered 2017-06-23: 30 mg via INTRAVENOUS
  Filled 2017-06-23: qty 1

## 2017-06-23 MED ORDER — SUCCINYLCHOLINE CHLORIDE 20 MG/ML IJ SOLN
INTRAMUSCULAR | Status: DC | PRN
  Administered 2017-06-23: 100 mg via INTRAVENOUS

## 2017-06-23 MED ORDER — ONDANSETRON HCL 4 MG/2ML IJ SOLN
INTRAMUSCULAR | Status: AC
  Administered 2017-06-23: 4 mg via INTRAVENOUS
  Filled 2017-06-23: qty 2

## 2017-06-23 MED ORDER — ONDANSETRON HCL 4 MG/2ML IJ SOLN
4.0000 mg | Freq: Once | INTRAMUSCULAR | Status: AC
  Administered 2017-06-23: 4 mg via INTRAVENOUS

## 2017-06-23 MED ORDER — KETOROLAC TROMETHAMINE 30 MG/ML IJ SOLN
30.0000 mg | Freq: Once | INTRAMUSCULAR | Status: AC
  Administered 2017-06-23: 30 mg via INTRAVENOUS

## 2017-06-23 MED ORDER — KETAMINE HCL 10 MG/ML IJ SOLN
INTRAMUSCULAR | Status: DC | PRN
  Administered 2017-06-23: 100 mg via INTRAVENOUS

## 2017-06-23 MED ORDER — SODIUM CHLORIDE 0.9 % IV SOLN
500.0000 mL | Freq: Once | INTRAVENOUS | Status: AC
  Administered 2017-06-23: 500 mL via INTRAVENOUS

## 2017-06-23 NOTE — Anesthesia Post-op Follow-up Note (Signed)
Anesthesia QCDR form completed.        

## 2017-06-23 NOTE — H&P (Deleted)
Katelyn ChurchChrista [name] is an 47 y.o. female.   Chief Complaint: Patient is feeling better.  Sad over realistic things about going home but not currently suicidal and feeling ready for discharge HPI: History of recurrent severe depression  Past Medical History:  Diagnosis Date  . Anxiety   . Arthritis    soriatic  . Bipolar affective disorder, manic, mild (HCC)   . Depression   . Migraine headache   . PTSD (post-traumatic stress disorder)     Past Surgical History:  Procedure Laterality Date  . CESAREAN SECTION    . CHOLECYSTECTOMY      Family History  Problem Relation Age of Onset  . Alcohol abuse Sister   . Depression Sister   . Alcohol abuse Maternal Grandfather   . Multiple sclerosis Mother    Social History:  reports that  has never smoked. she has never used smokeless tobacco. She reports that she drinks alcohol. She reports that she does not use drugs.  Allergies:  Allergies  Allergen Reactions  . Prednisone Other (See Comments)    Spikes mania episodes     (Not in a hospital admission)  Results for orders placed or performed during the hospital encounter of 06/23/17 (from the past 48 hour(s))  Pregnancy, urine POC     Status: None   Collection Time: 06/23/17  9:52 AM  Result Value Ref Range   Preg Test, Ur NEGATIVE NEGATIVE    Comment:        THE SENSITIVITY OF THIS METHODOLOGY IS >24 mIU/mL    No results found.  Review of Systems  Constitutional: Negative.   HENT: Negative.   Eyes: Negative.   Respiratory: Negative.   Cardiovascular: Negative.   Gastrointestinal: Negative.   Musculoskeletal: Negative.   Skin: Negative.   Neurological: Negative.   Psychiatric/Behavioral: Negative for depression, hallucinations, memory loss, substance abuse and suicidal ideas. The patient is not nervous/anxious and does not have insomnia.     Blood pressure 128/61, pulse 86, temperature 99 F (37.2 C), resp. rate 16, height 5\' 2"  (1.575 m), weight 65.8 kg (145 lb),  last menstrual period 06/05/2017, SpO2 100 %. Physical Exam  Nursing note and vitals reviewed. Constitutional: She appears well-developed and well-nourished.  HENT:  Head: Normocephalic and atraumatic.  Eyes: Conjunctivae are normal. Pupils are equal, round, and reactive to light.  Neck: Normal range of motion.  Cardiovascular: Regular rhythm and normal heart sounds.  Respiratory: Effort normal. No respiratory distress.  GI: Soft.  Musculoskeletal: Normal range of motion.  Neurological: She is alert.  Skin: Skin is warm and dry.  Psychiatric: She has a normal mood and affect. Her behavior is normal. Judgment and thought content normal.     Assessment/Plan Follow-up not scheduled at this time.  Mordecai RasmussenJohn Prayan Ulin, MD 06/23/2017, 10:56 AM

## 2017-06-23 NOTE — Procedures (Signed)
ECT SERVICES Physician's Interval Evaluation & Treatment Note  Patient Identification: Katelyn ChurchChrista [name] MRN:  [id number] Date of Evaluation:  06/23/2017 TX #: 6  MADRS:   MMSE:   P.E. Findings: No change to physical exam  No change to physical exam.  Vitals unremarkable  Psychiatric Interval Note:  Patient reports she is feeling better she is also smiling and generally looking better and does not have any significant complaints  Subjective:  Patient is a 47 y.o. female seen for evaluation for Electroconvulsive Therapy. So far not having any significant memory impairment and does feel like her mood is improved  Treatment Summary:   [x]   Right Unilateral             []  Bilateral   % Energy : 0.3 ms 100%   Impedance: 1870 ohms  Seizure Energy Index: No reading but it looked reasonable  Postictal Suppression Index: Also no reading  Seizure Concordance Index: 92%  Medications  Pre Shock: Toradol 30 mg Zofran 4 mg ketamine 100 mg succinylcholine 100 mg  Post Shock: None  Seizure Duration: 12 seconds by EMG and 12 seconds by EEG   Comments: This was a short seizure of uncertain benefit.  I would still like to see her back on Monday for follow-up treatment although we may be at the point of moving to maintenance  Lungs:  [x]   Clear to auscultation               []  Other:   Heart:    [x]   Regular rhythm             []  irregular rhythm    [x]   Previous H&P reviewed, patient examined and there are NO CHANGES                 []   Previous H&P reviewed, patient examined and there are changes noted.   Mordecai RasmussenJohn Clapacs, MD 3/1/201910:37 AM

## 2017-06-23 NOTE — Transfer of Care (Signed)
Immediate Anesthesia Transfer of Care Note  Patient: Katelyn [name]  Procedure(s) Performed: ECT TX  Patient Location: PACU  Anesthesia Type:General  Level of Consciousness: sedated  Airway & Oxygen Therapy: Patient connected to face mask oxygen  Post-op Assessment: Post -op Vital signs reviewed and stable  Post vital signs: stable  Last Vitals:  Vitals:   06/23/17 1051 06/23/17 1052  BP: 128/61 128/61  Pulse: 86 86  Resp: 16 16  Temp: 37.2 C   SpO2: 100% 100%    Last Pain:  Vitals:   06/23/17 0909  TempSrc: Oral  PainSc: 0-No pain         Complications: No apparent anesthesia complications

## 2017-06-23 NOTE — Anesthesia Preprocedure Evaluation (Signed)
Anesthesia Evaluation  Patient identified by MRN, date of birth, ID band Patient awake    Reviewed: Allergy & Precautions, H&P , NPO status , Patient's Chart, lab work & pertinent test results, reviewed documented beta blocker date and time   Airway Mallampati: II   Neck ROM: full    Dental  (+) Poor Dentition   Pulmonary neg pulmonary ROS,    Pulmonary exam normal        Cardiovascular Exercise Tolerance: Good negative cardio ROS Normal cardiovascular exam Rhythm:regular Rate:Normal     Neuro/Psych  Headaches, PSYCHIATRIC DISORDERS Anxiety Depression Bipolar Disorder  Neuromuscular disease negative neurological ROS  negative psych ROS   GI/Hepatic negative GI ROS, Neg liver ROS,   Endo/Other  negative endocrine ROS  Renal/GU negative Renal ROS  negative genitourinary   Musculoskeletal   Abdominal   Peds  Hematology negative hematology ROS (+)   Anesthesia Other Findings Past Medical History: No date: Anxiety No date: Arthritis     Comment:  soriatic No date: Bipolar affective disorder, manic, mild (HCC) No date: Depression No date: Migraine headache No date: PTSD (post-traumatic stress disorder) Past Surgical History: No date: CESAREAN SECTION No date: CHOLECYSTECTOMY BMI    Body Mass Index:  26.52 kg/m     Reproductive/Obstetrics negative OB ROS                             Anesthesia Physical Anesthesia Plan  ASA: II  Anesthesia Plan: General   Post-op Pain Management:    Induction:   PONV Risk Score and Plan:   Airway Management Planned:   Additional Equipment:   Intra-op Plan:   Post-operative Plan:   Informed Consent: I have reviewed the patients History and Physical, chart, labs and discussed the procedure including the risks, benefits and alternatives for the proposed anesthesia with the patient or authorized representative who has indicated his/her  understanding and acceptance.   Dental Advisory Given  Plan Discussed with: CRNA  Anesthesia Plan Comments:         Anesthesia Quick Evaluation

## 2017-06-23 NOTE — Discharge Instructions (Signed)
1)  The drugs that you have been given will stay in your system until tomorrow so for the       next 24 hours you should not:  A. Drive an automobile  B. Make any legal decisions  C. Drink any alcoholic beverages  2)  You may resume your regular meals upon return home.  3)  A responsible adult must take you home.  Someone should stay with you for a few          hours, then be available by phone for the remainder of the treatment day.  4)  You May experience any of the following symptoms:  Headache, Nausea and a dry mouth (due to the medications you were given),  temporary memory loss and some confusion, or sore muscles (a warm bath  should help this).  If you you experience any of these symptoms let us know on                your return visit.  5)  Report any of the following: any acute discomfort, severe headache, or temperature        greater than 100.5 F.   Also report any unusual redness, swelling, drainage, or pain         at your IV site.    You may report Symptoms to:  ECT PROGRAM- [place] [facility]          Phone: [phone], ECT Department           or Dr. Shary Keylapac's office [phone]  6)  Your next ECT Treatment is Monday March 4 at 9:00  We will call 2 days prior to your scheduled appointment for arrival times.  7)  Nothing to eat or drink after midnight the night before your procedure.  8)  Take      With a sip of water the morning of your procedure.  9)  Other Instructions: Call [phone] to cancel the morning of your procedure due         to illness or emergency.  10) We will call within 72 hours to assess how you are feeling.

## 2017-06-23 NOTE — Anesthesia Postprocedure Evaluation (Signed)
Anesthesia Post Note  Patient: Katelyn [name]  Procedure(s) Performed: ECT TX  Patient location during evaluation: PACU Anesthesia Type: General Level of consciousness: awake and alert Pain management: pain level controlled Vital Signs Assessment: post-procedure vital signs reviewed and stable Respiratory status: spontaneous breathing, nonlabored ventilation, respiratory function stable and patient connected to nasal cannula oxygen Cardiovascular status: blood pressure returned to baseline and stable Postop Assessment: no apparent nausea or vomiting Anesthetic complications: no     Last Vitals:  Vitals:   06/23/17 1051 06/23/17 1052  BP: 128/61 128/61  Pulse: 86 86  Resp: 16 16  Temp: 37.2 C   SpO2: 100% 100%    Last Pain:  Vitals:   06/23/17 1051  TempSrc:   PainSc: Asleep                 Yevette EdwardsJames G Adams

## 2017-06-23 NOTE — Telephone Encounter (Signed)
Called to check authorization of ECT. [facility] 12 units have been approved from 06/12/17-08/10/17. Auth #[id number].

## 2017-06-23 NOTE — Anesthesia Procedure Notes (Signed)
Procedure Name: General with mask airway Performed by: Berlie Persky, CRNA Pre-anesthesia Checklist: Patient identified, Emergency Drugs available, Suction available, Patient being monitored and Timeout performed Patient Re-evaluated:Patient Re-evaluated prior to induction Oxygen Delivery Method: Circle system utilized Preoxygenation: Pre-oxygenation with 100% oxygen Induction Type: IV induction Ventilation: Mask ventilation without difficulty Airway Equipment and Method: Bite block       

## 2017-06-23 NOTE — Procedures (Deleted)
ECT SERVICES Physician's Interval Evaluation & Treatment Note  Patient Identification: Katelyn ChurchChrista [name] MRN:  [id number] Date of Evaluation:  06/23/2017 TX #: 3  MADRS:   MMSE:   P.E. Findings:  No change to physical exam.  Blood pressure better.  Psychiatric Interval Note:  Prove not actively suicidal  Subjective:  Patient is a 47 y.o. female seen for evaluation for Electroconvulsive Therapy. Feeling clearly improved  Treatment Summary:   []   Right Unilateral             [x]  Bilateral   % Energy : 1.0 ms 30%   Impedance: 1280 ohms  Seizure Energy Index: 21,010 V squared  Postictal Suppression Index: 76%  Seizure Concordance Index: 99%  Medications  Pre Shock: Brevital 70 mg succinylcholine 80 mg  Post Shock:    Seizure Duration: 41 seconds by EMG, the computer read 99 seconds for EEG but I think 75 is a more accurate number   Comments: Follow-up not scheduled for now due to lack of outpatient resources  Lungs:  [x]   Clear to auscultation               []  Other:   Heart:    [x]   Regular rhythm             []  irregular rhythm    [x]   Previous H&P reviewed, patient examined and there are NO CHANGES                 []   Previous H&P reviewed, patient examined and there are changes noted.   Katelyn RasmussenJohn Woodie Trusty, MD 3/1/201910:58 AM

## 2017-06-23 NOTE — H&P (Signed)
Katelyn ChurchChrista [name] is an 47 y.o. female.   Chief Complaint: Patient is feeling better.  Mood is seeming to be consistently improved.  Myalgias under much better control.  No major memory problems HPI: History of bipolar disorder with an episode of severe depression recently  Past Medical History:  Diagnosis Date  . Anxiety   . Arthritis    soriatic  . Bipolar affective disorder, manic, mild (HCC)   . Depression   . Migraine headache   . PTSD (post-traumatic stress disorder)     Past Surgical History:  Procedure Laterality Date  . CESAREAN SECTION    . CHOLECYSTECTOMY      Family History  Problem Relation Age of Onset  . Alcohol abuse Sister   . Depression Sister   . Alcohol abuse Maternal Grandfather   . Multiple sclerosis Mother    Social History:  reports that  has never smoked. she has never used smokeless tobacco. She reports that she drinks alcohol. She reports that she does not use drugs.  Allergies:  Allergies  Allergen Reactions  . Prednisone Other (See Comments)    Spikes mania episodes     (Not in a hospital admission)  Results for orders placed or performed during the hospital encounter of 06/23/17 (from the past 48 hour(s))  Pregnancy, urine POC     Status: None   Collection Time: 06/23/17  9:52 AM  Result Value Ref Range   Preg Test, Ur NEGATIVE NEGATIVE    Comment:        THE SENSITIVITY OF THIS METHODOLOGY IS >24 mIU/mL    No results found.  Review of Systems  Constitutional: Negative.   HENT: Negative.   Eyes: Negative.   Respiratory: Negative.   Cardiovascular: Negative.   Gastrointestinal: Negative.   Musculoskeletal: Negative.   Skin: Negative.   Neurological: Negative.   Psychiatric/Behavioral: Negative for depression, hallucinations, memory loss, substance abuse and suicidal ideas. The patient is not nervous/anxious and does not have insomnia.     Blood pressure (!) 120/42, pulse 85, temperature 98.2 F (36.8 C), temperature source  Oral, resp. rate 16, height 5\' 2"  (1.575 m), weight 65.8 kg (145 lb), last menstrual period 06/05/2017, SpO2 100 %. Physical Exam  Nursing note and vitals reviewed. Constitutional: She appears well-developed and well-nourished.  HENT:  Head: Normocephalic and atraumatic.  Eyes: Conjunctivae are normal. Pupils are equal, round, and reactive to light.  Neck: Normal range of motion.  Cardiovascular: Regular rhythm and normal heart sounds.  Respiratory: Effort normal. No respiratory distress.  GI: Soft.  Musculoskeletal: Normal range of motion.  Neurological: She is alert.  Skin: Skin is warm and dry.  Psychiatric: She has a normal mood and affect. Her behavior is normal. Judgment and thought content normal.     Assessment/Plan Today is treatment #6, 4 of the effective series.  Patient is showing clear improvement.  Recommend that we continue into next week but are in a phase of starting to look for stabilization and change to maintenance  Katelyn RasmussenJohn Clapacs, MD 06/23/2017, 10:35 AM

## 2017-06-26 ENCOUNTER — Encounter: Payer: Self-pay | Admitting: Anesthesiology

## 2017-06-26 ENCOUNTER — Other Ambulatory Visit: Payer: Self-pay | Admitting: Psychiatry

## 2017-06-26 ENCOUNTER — Encounter
Admission: RE | Admit: 2017-06-26 | Discharge: 2017-06-26 | Disposition: A | Discharge status: Home / Self Care | Payer: BC Managed Care – PPO | Source: Ambulatory Visit | Attending: Psychiatry | Admitting: Psychiatry

## 2017-06-26 DIAGNOSIS — Z811 Family history of alcohol abuse and dependence: Secondary | ICD-10-CM | POA: Diagnosis not present

## 2017-06-26 DIAGNOSIS — F3130 Bipolar disorder, current episode depressed, mild or moderate severity, unspecified: Secondary | ICD-10-CM

## 2017-06-26 DIAGNOSIS — F319 Bipolar disorder, unspecified: Secondary | ICD-10-CM | POA: Insufficient documentation

## 2017-06-26 DIAGNOSIS — F4310 Post-traumatic stress disorder, unspecified: Secondary | ICD-10-CM | POA: Diagnosis not present

## 2017-06-26 DIAGNOSIS — Z9889 Other specified postprocedural states: Secondary | ICD-10-CM | POA: Diagnosis not present

## 2017-06-26 DIAGNOSIS — Z888 Allergy status to other drugs, medicaments and biological substances status: Secondary | ICD-10-CM | POA: Insufficient documentation

## 2017-06-26 DIAGNOSIS — Z818 Family history of other mental and behavioral disorders: Secondary | ICD-10-CM | POA: Insufficient documentation

## 2017-06-26 DIAGNOSIS — L4050 Arthropathic psoriasis, unspecified: Secondary | ICD-10-CM | POA: Diagnosis not present

## 2017-06-26 DIAGNOSIS — Z9049 Acquired absence of other specified parts of digestive tract: Secondary | ICD-10-CM | POA: Diagnosis not present

## 2017-06-26 MED ORDER — ONDANSETRON HCL 4 MG/2ML IJ SOLN
INTRAMUSCULAR | Status: AC
  Administered 2017-06-26: 4 mg via INTRAVENOUS
  Filled 2017-06-26: qty 2

## 2017-06-26 MED ORDER — ONDANSETRON HCL 4 MG/2ML IJ SOLN
4.0000 mg | Freq: Once | INTRAMUSCULAR | Status: AC
  Administered 2017-06-26: 4 mg via INTRAVENOUS

## 2017-06-26 MED ORDER — SODIUM CHLORIDE 0.9 % IV SOLN
INTRAVENOUS | Status: DC | PRN
  Administered 2017-06-26: 10:00:00 via INTRAVENOUS

## 2017-06-26 MED ORDER — KETAMINE HCL 50 MG/ML IJ SOLN
INTRAMUSCULAR | Status: AC
  Filled 2017-06-26: qty 10

## 2017-06-26 MED ORDER — SODIUM CHLORIDE 0.9 % IV SOLN
500.0000 mL | Freq: Once | INTRAVENOUS | Status: AC
  Administered 2017-06-26: 500 mL via INTRAVENOUS

## 2017-06-26 MED ORDER — KETOROLAC TROMETHAMINE 30 MG/ML IJ SOLN
30.0000 mg | Freq: Once | INTRAMUSCULAR | Status: AC
  Administered 2017-06-26: 30 mg via INTRAVENOUS

## 2017-06-26 MED ORDER — ONDANSETRON HCL 4 MG/2ML IJ SOLN: 4.0000 mg | Freq: Once | INTRAMUSCULAR | Status: DC | PRN

## 2017-06-26 MED ORDER — FENTANYL CITRATE (PF) 100 MCG/2ML IJ SOLN: 25.0000 ug | INTRAMUSCULAR | Status: DC | PRN

## 2017-06-26 MED ORDER — SUCCINYLCHOLINE CHLORIDE 200 MG/10ML IV SOSY
PREFILLED_SYRINGE | INTRAVENOUS | Status: DC | PRN
  Administered 2017-06-26: 100 mg via INTRAVENOUS

## 2017-06-26 MED ORDER — KETAMINE HCL 10 MG/ML IJ SOLN
INTRAMUSCULAR | Status: DC | PRN
  Administered 2017-06-26: 100 mg via INTRAVENOUS

## 2017-06-26 MED ORDER — KETOROLAC TROMETHAMINE 30 MG/ML IJ SOLN
INTRAMUSCULAR | Status: AC
  Administered 2017-06-26: 30 mg via INTRAVENOUS
  Filled 2017-06-26: qty 1

## 2017-06-26 MED ORDER — LIDOCAINE 2% (20 MG/ML) 5 ML SYRINGE
INTRAMUSCULAR | Status: DC | PRN
  Administered 2017-06-26: 100 mg via INTRAVENOUS

## 2017-06-26 NOTE — H&P (Signed)
Jake ChurchChrista Kochan is an 47 y.o. female.   Chief Complaint: Patient continues to feel improved.  Not feeling depressed.  No current suicidal ideation and no sign of mania HPI: History of bipolar disorder with severe depression now showing improvement  Past Medical History:  Diagnosis Date  . Anxiety   . Arthritis    soriatic  . Bipolar affective disorder, manic, mild (HCC)   . Depression   . Migraine headache   . PTSD (post-traumatic stress disorder)     Past Surgical History:  Procedure Laterality Date  . CESAREAN SECTION    . CHOLECYSTECTOMY      Family History  Problem Relation Age of Onset  . Alcohol abuse Sister   . Depression Sister   . Alcohol abuse Maternal Grandfather   . Multiple sclerosis Mother    Social History:  reports that  has never smoked. she has never used smokeless tobacco. She reports that she drinks alcohol. She reports that she does not use drugs.  Allergies:  Allergies  Allergen Reactions  . Prednisone Other (See Comments)    Spikes mania episodes     (Not in a hospital admission)  No results found for this or any previous visit (from the past 48 hour(s)). No results found.  Review of Systems  Constitutional: Negative.   HENT: Negative.   Eyes: Negative.   Respiratory: Negative.   Cardiovascular: Negative.   Gastrointestinal: Negative.   Musculoskeletal: Negative.   Skin: Negative.   Neurological: Negative.   Psychiatric/Behavioral: Negative for depression, hallucinations, memory loss, substance abuse and suicidal ideas. The patient is not nervous/anxious and does not have insomnia.     Blood pressure (!) 108/48, pulse 84, temperature 98.7 F (37.1 C), temperature source Oral, resp. rate 16, height 5\' 2"  (1.575 m), weight 65.8 kg (145 lb), last menstrual period 06/05/2017, SpO2 99 %. Physical Exam  Nursing note and vitals reviewed. Constitutional: She appears well-developed and well-nourished.  HENT:  Head: Normocephalic and  atraumatic.  Eyes: Conjunctivae are normal. Pupils are equal, round, and reactive to light.  Neck: Normal range of motion.  Cardiovascular: Regular rhythm and normal heart sounds.  Respiratory: Effort normal. No respiratory distress.  GI: Soft.  Musculoskeletal: Normal range of motion.  Neurological: She is alert.  Skin: Skin is warm and dry.  Psychiatric: She has a normal mood and affect. Her speech is normal and behavior is normal. Judgment and thought content normal. Cognition and memory are normal.     Assessment/Plan Treatment today and then we are planning a follow-up in 1 week for reassessment  Mordecai RasmussenJohn Clapacs, MD 06/26/2017, 10:05 AM

## 2017-06-26 NOTE — Anesthesia Procedure Notes (Signed)
Date/Time: 06/26/2017 10:09 AM Performed by: Junious SilkNoles, Devaris Quirk, CRNA Pre-anesthesia Checklist: Patient identified, Emergency Drugs available, Suction available, Patient being monitored and Timeout performed Oxygen Delivery Method: Ambu bag Ventilation: Mask ventilation throughout procedure

## 2017-06-26 NOTE — Procedures (Signed)
ECT SERVICES Physician's Interval Evaluation & Treatment Note  Patient Identification: Katelyn ChurchChrista [name] MRN:  [id number] Date of Evaluation:  06/26/2017 TX #: 7  MADRS: 9  MMSE: 30 P.E. Findings:  No change to physical exam.  Heart and lungs normal.  Vitals unremarkable.  Psychiatric Interval Note:  Mood reported is better.  No sign of cognitive impairment.  Subjective:  Patient is a 47 y.o. female seen for evaluation for Electroconvulsive Therapy. No subjective complaint.  Treatment Summary:   [x]   Right Unilateral             []  Bilateral   % Energy : 0.3 ms 100%   Impedance: 1880 ohms  Seizure Energy Index: 10,715 V squared  Postictal Suppression Index: 88%  Seizure Concordance Index: No reading  Medications  Pre Shock: Toradol 30 mg Zofran 4 mg ketamine 100 mg succinylcholine 100 mg  Post Shock:    Seizure Duration: 6 seconds EMG 9 seconds EEG   Comments: Patient seems to have plateaued we will see her back in 1 week and then reconsider whether there would be an appropriate role for maintenance treatment  Lungs:  [x]   Clear to auscultation               []  Other:   Heart:    [x]   Regular rhythm             []  irregular rhythm    [x]   Previous H&P reviewed, patient examined and there are NO CHANGES                 []   Previous H&P reviewed, patient examined and there are changes noted.   Mordecai RasmussenJohn Clapacs, MD 3/4/201910:06 AM

## 2017-06-26 NOTE — Anesthesia Preprocedure Evaluation (Addendum)
Anesthesia Evaluation  Patient identified by MRN, date of birth, ID band Patient awake    Reviewed: Allergy & Precautions, NPO status , Patient's Chart, lab work & pertinent test results, reviewed documented beta blocker date and time   Airway Mallampati: II  TM Distance: >3 FB     Dental  (+) Chipped   Pulmonary           Cardiovascular      Neuro/Psych  Headaches, PSYCHIATRIC DISORDERS Anxiety Depression Bipolar Disorder  Neuromuscular disease    GI/Hepatic   Endo/Other    Renal/GU      Musculoskeletal  (+) Arthritis ,   Abdominal   Peds  Hematology   Anesthesia Other Findings   Reproductive/Obstetrics                            Anesthesia Physical Anesthesia Plan  ASA: III  Anesthesia Plan: General   Post-op Pain Management:    Induction: Intravenous  PONV Risk Score and Plan:   Airway Management Planned:   Additional Equipment:   Intra-op Plan:   Post-operative Plan:   Informed Consent: I have reviewed the patients History and Physical, chart, labs and discussed the procedure including the risks, benefits and alternatives for the proposed anesthesia with the patient or authorized representative who has indicated his/her understanding and acceptance.     Plan Discussed with: CRNA  Anesthesia Plan Comments:         Anesthesia Quick Evaluation

## 2017-06-26 NOTE — Anesthesia Post-op Follow-up Note (Signed)
Anesthesia QCDR form completed.        

## 2017-06-26 NOTE — Discharge Instructions (Signed)
1)  The drugs that you have been given will stay in your system until tomorrow so for the       next 24 hours you should not:  A. Drive an automobile  B. Make any legal decisions  C. Drink any alcoholic beverages  2)  You may resume your regular meals upon return home.  3)  A responsible adult must take you home.  Someone should stay with you for a few          hours, then be available by phone for the remainder of the treatment day.  4)  You May experience any of the following symptoms:  Headache, Nausea and a dry mouth (due to the medications you were given),  temporary memory loss and some confusion, or sore muscles (a warm bath  should help this).  If you you experience any of these symptoms let us know on                your return visit.  5)  Report any of the following: any acute discomfort, severe headache, or temperature        greater than 100.5 F.   Also report any unusual redness, swelling, drainage, or pain         at your IV site.    You may report Symptoms to:  [facility]          Phone: [phone], ECT Department           or Dr. Shary Keylapac's office [phone]  6)  Your next ECT Treatment is Monday March 11 at 9:00  We will call 2 days prior to your scheduled appointment for arrival times.  7)  Nothing to eat or drink after midnight the night before your procedure.  8)  Take     With a sip of water the morning of your procedure.  9)  Other Instructions: Call [phone] to cancel the morning of your procedure due         to illness or emergency.  10) We will call within 72 hours to assess how you are feeling.

## 2017-06-26 NOTE — Transfer of Care (Signed)
Immediate Anesthesia Transfer of Care Note  Patient: Katelyn [name]  Procedure(s) Performed: ECT TX  Patient Location: PACU  Anesthesia Type:General  Level of Consciousness: sedated  Airway & Oxygen Therapy: Patient Spontanous Breathing and Patient connected to face mask oxygen  Post-op Assessment: Report given to RN  Post vital signs: Reviewed and stable  Last Vitals:  Vitals:   06/26/17 0922 06/26/17 1025  BP: (!) 108/48   Pulse: 84 99  Resp: 16 18  Temp: 37.1 C 36.8 C  SpO2: 99% 99%    Last Pain:  Vitals:   06/26/17 1025  TempSrc:   PainSc: Asleep         Complications: No apparent anesthesia complications

## 2017-06-27 NOTE — Anesthesia Postprocedure Evaluation (Signed)
Anesthesia Post Note  Patient: Elsa Veals  Procedure(s) Performed: ECT TX  Patient location during evaluation: PACU Anesthesia Type: General Level of consciousness: awake and alert Pain management: pain level controlled Vital Signs Assessment: post-procedure vital signs reviewed and stable Respiratory status: spontaneous breathing, nonlabored ventilation, respiratory function stable and patient connected to nasal cannula oxygen Cardiovascular status: blood pressure returned to baseline and stable Postop Assessment: no apparent nausea or vomiting Anesthetic complications: no     Last Vitals:  Vitals:   06/26/17 1045 06/26/17 1053  BP:  99/85  Pulse: 89 89  Resp: 13 16  Temp: 37.4 C 37.2 C  SpO2: 100%     Last Pain:  Vitals:   06/26/17 1053  TempSrc: Oral  PainSc:                  Angee Gupton S

## 2017-06-30 ENCOUNTER — Telehealth: Payer: Self-pay | Admitting: *Deleted

## 2017-07-03 ENCOUNTER — Telehealth: Payer: Self-pay | Admitting: *Deleted

## 2017-07-03 ENCOUNTER — Other Ambulatory Visit: Payer: Self-pay | Admitting: Psychiatry

## 2018-04-17 IMAGING — CR DG CHEST 2V
2 series · 2 of 2 positions shown · non-contrast
Comparison: No comparison studies available.

CLINICAL DATA: Patient old this morning with anxiety and mid chest
pain.

EXAM:
CHEST  2 VIEW

[w chest pa]
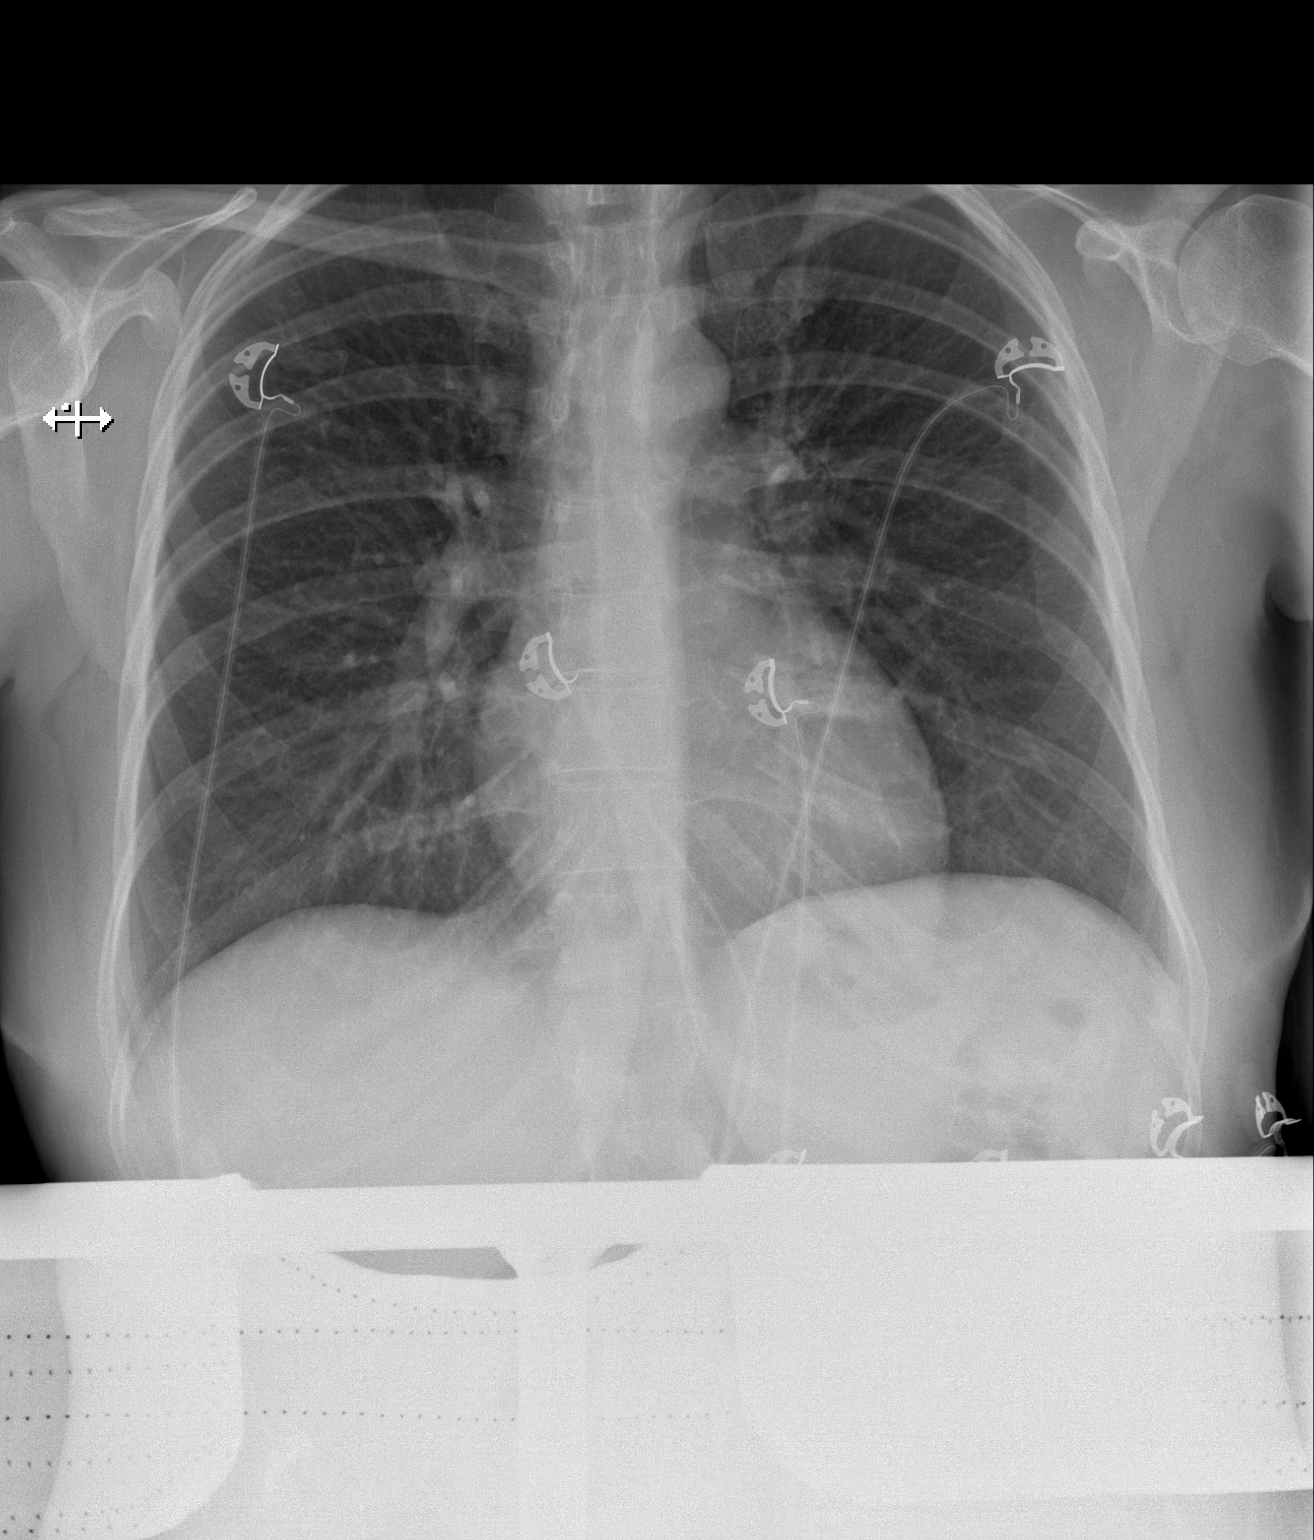

[w chest lat]
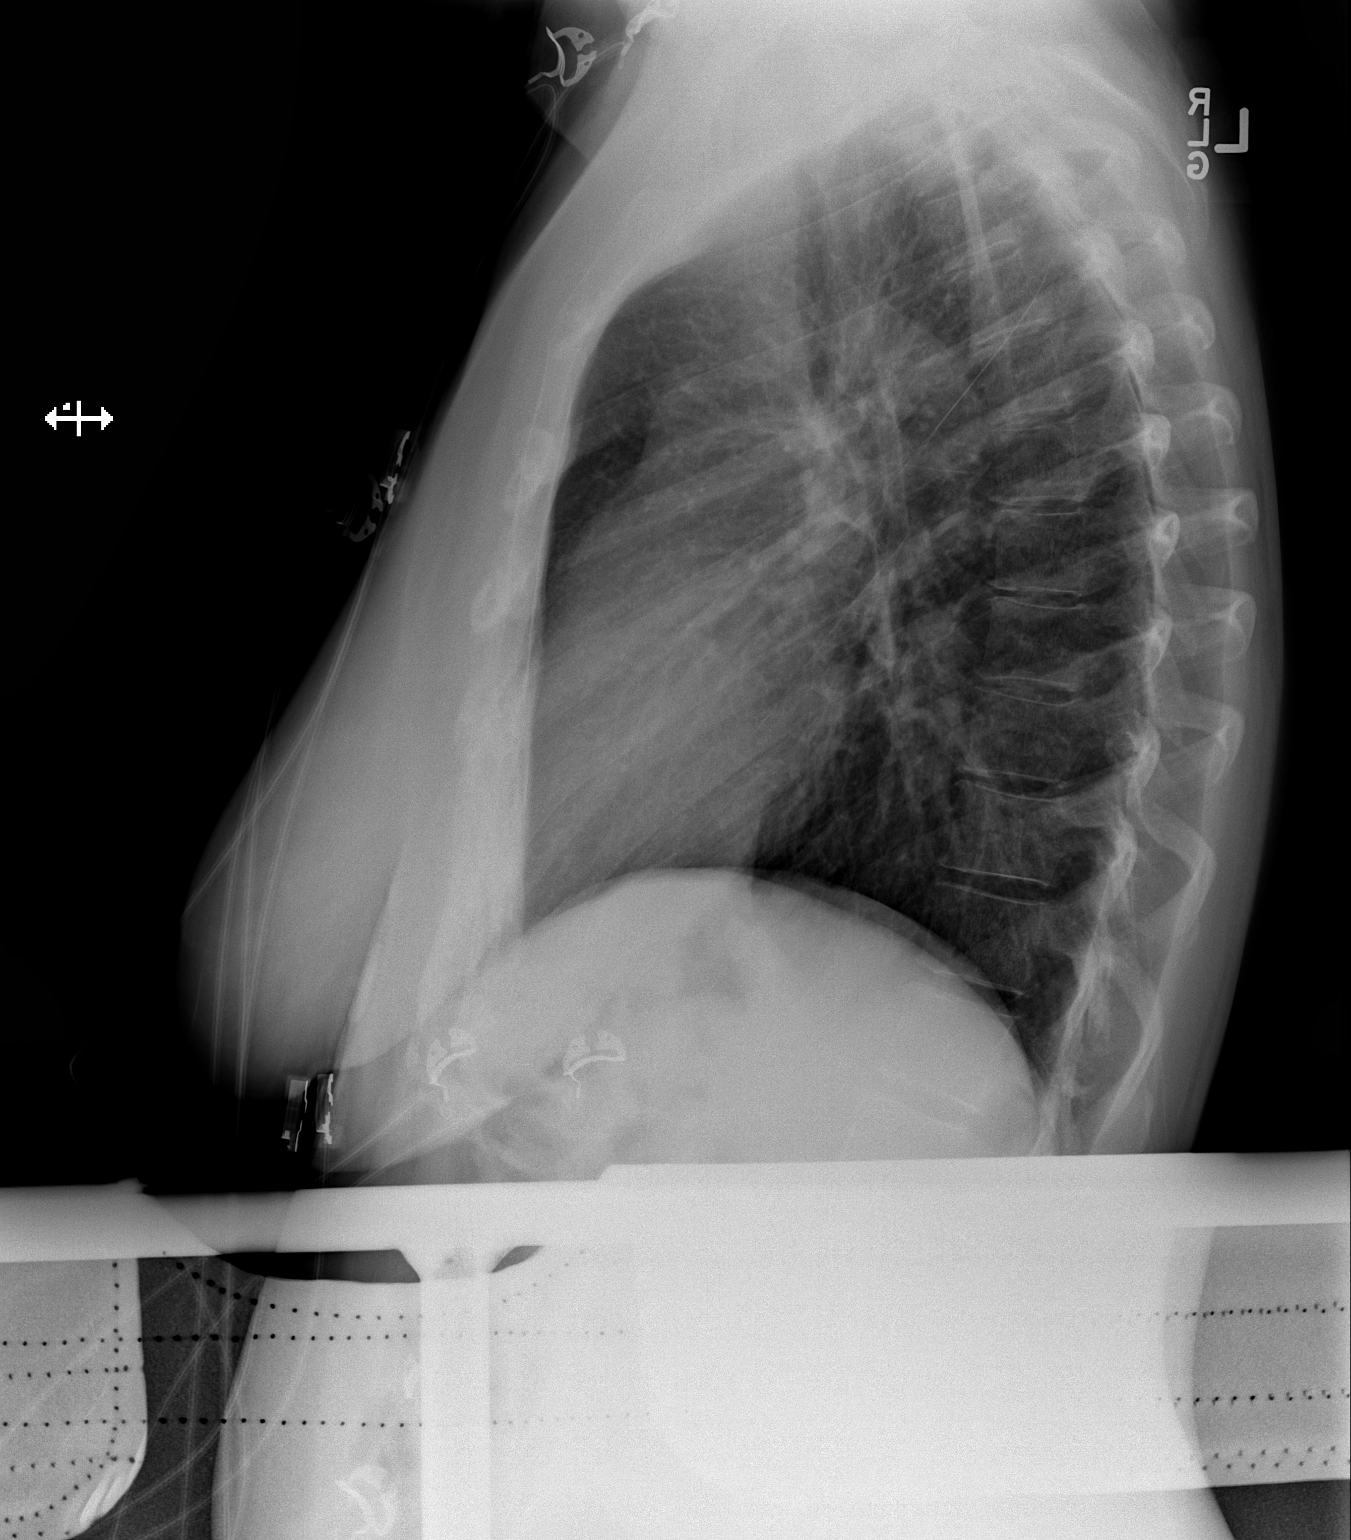

[2 of 2 positions shown; findings below may reference images not displayed]

FINDINGS: The lungs are clear wiithout focal pneumonia, edema, pneumothorax or
pleural effusion. The cardiopericardial silhouette is within normal
limits for size. The visualized bony structures of the thorax are
intact. Telemetry leads overlie the chest.
IMPRESSION: No active cardiopulmonary disease.

## 2020-12-23 ENCOUNTER — Emergency Department (HOSPITAL_COMMUNITY): Payer: BC Managed Care – PPO

## 2020-12-23 ENCOUNTER — Other Ambulatory Visit: Payer: Self-pay

## 2020-12-23 ENCOUNTER — Encounter (HOSPITAL_COMMUNITY): Payer: Self-pay | Admitting: *Deleted

## 2020-12-23 ENCOUNTER — Emergency Department (HOSPITAL_COMMUNITY)
Admission: EM | Admit: 2020-12-23 | Discharge: 2020-12-23 | Disposition: A | Discharge status: Home / Self Care | Payer: BC Managed Care – PPO | Attending: Emergency Medicine | Admitting: Emergency Medicine

## 2020-12-23 DIAGNOSIS — R112 Nausea with vomiting, unspecified: Secondary | ICD-10-CM | POA: Diagnosis not present

## 2020-12-23 DIAGNOSIS — R072 Precordial pain: Secondary | ICD-10-CM | POA: Diagnosis present

## 2020-12-23 DIAGNOSIS — R0602 Shortness of breath: Secondary | ICD-10-CM | POA: Diagnosis not present

## 2020-12-23 DIAGNOSIS — M549 Dorsalgia, unspecified: Secondary | ICD-10-CM | POA: Diagnosis not present

## 2020-12-23 DIAGNOSIS — R0789 Other chest pain: Secondary | ICD-10-CM

## 2020-12-23 DIAGNOSIS — R197 Diarrhea, unspecified: Secondary | ICD-10-CM | POA: Insufficient documentation

## 2020-12-23 DIAGNOSIS — F419 Anxiety disorder, unspecified: Secondary | ICD-10-CM | POA: Insufficient documentation

## 2020-12-23 DIAGNOSIS — R079 Chest pain, unspecified: Secondary | ICD-10-CM

## 2020-12-23 LAB — BASIC METABOLIC PANEL
Anion gap: 8 (ref 5–15)
BUN: 19 mg/dL (ref 6–20)
CO2: 25 mmol/L (ref 22–32)
Calcium: 9.2 mg/dL (ref 8.9–10.3)
Chloride: 110 mmol/L (ref 98–111)
Creatinine, Ser: 1.06 mg/dL — ABNORMAL HIGH (ref 0.44–1.00)
GFR, Estimated: >60 mL/min (ref >60)
Glucose, Bld: 98 mg/dL (ref 70–99)
Potassium: 4.1 mmol/L (ref 3.5–5.1)
Sodium: 143 mmol/L (ref 135–145)

## 2020-12-23 LAB — CBC
HCT: 39.1 % (ref 36.0–46.0)
Hemoglobin: 13.5 g/dL (ref 12.0–15.0)
MCH: 33.3 pg (ref 26.0–34.0)
MCHC: 34.5 g/dL (ref 30.0–36.0)
MCV: 96.5 fL (ref 80.0–100.0)
Platelets: 271 10*3/uL (ref 150–400)
RBC: 4.05 MIL/uL (ref 3.87–5.11)
RDW: 12.4 % (ref 11.5–15.5)
WBC: 9.3 10*3/uL (ref 4.0–10.5)
nRBC: 0 % (ref 0.0–0.2)

## 2020-12-23 LAB — I-STAT BETA HCG BLOOD, ED (MC, WL, AP ONLY): I-stat hCG, quantitative: <5 m[IU]/mL (ref <5)

## 2020-12-23 LAB — HEPATIC FUNCTION PANEL
ALT: 20 U/L (ref 0–44)
AST: 30 U/L (ref 15–41)
Albumin: 3.5 g/dL (ref 3.5–5.0)
Alkaline Phosphatase: 43 U/L (ref 38–126)
Bilirubin, Direct: 0.2 mg/dL (ref 0.0–0.2)
Indirect Bilirubin: 0.6 mg/dL (ref 0.3–0.9)
Total Bilirubin: 0.8 mg/dL (ref 0.3–1.2)
Total Protein: 6.5 g/dL (ref 6.5–8.1)

## 2020-12-23 LAB — TROPONIN I (HIGH SENSITIVITY)
Troponin I (High Sensitivity): <2 ng/L (ref <18)
Troponin I (High Sensitivity): <2 ng/L (ref <18)

## 2020-12-23 LAB — LIPASE, BLOOD: Lipase: 32 U/L (ref 11–51)

## 2020-12-23 MED ORDER — ONDANSETRON 4 MG PO TBDP
4.0000 mg | ORAL_TABLET | Freq: Once | ORAL | Status: AC
  Administered 2020-12-23: 4 mg via ORAL
  Filled 2020-12-23: qty 1

## 2020-12-23 MED ORDER — KETOROLAC TROMETHAMINE 15 MG/ML IJ SOLN
30.0000 mg | Freq: Once | INTRAMUSCULAR | Status: AC
  Administered 2020-12-23: 30 mg via INTRAMUSCULAR
  Filled 2020-12-23: qty 2

## 2020-12-23 MED ORDER — LORAZEPAM 1 MG PO TABS
1.0000 mg | ORAL_TABLET | Freq: Once | ORAL | Status: AC
  Administered 2020-12-23: 1 mg via ORAL
  Filled 2020-12-23: qty 1

## 2020-12-23 NOTE — ED Triage Notes (Signed)
Pt complains of nausea, emesis, chest pain x 3 hours. Also some diarrhea. Hx of panic attacks with chest pain, states this feels worse.

## 2020-12-23 NOTE — ED Notes (Signed)
Per lab, lipase and hepatic function can be ran on specimens already in lab.

## 2020-12-23 NOTE — ED Provider Notes (Signed)
[facility]-EMERGENCY DEPT Provider Note   CSN: [id number] Arrival date & time: 12/23/20  [id number]     History Chief Complaint  Patient presents with   Chest Pain   Emesis    Katelyn [name] is a 50 y.o. female.   Chest Pain Pain location:  Substernal area Pain quality: pressure   Pain radiates to:  Mid back Pain severity:  Moderate Onset quality:  Sudden Duration:  3 hours Timing:  Constant Progression:  Unchanged Chronicity:  New Context: at rest   Relieved by:  None tried Worsened by:  Nothing Ineffective treatments:  Rest Associated symptoms: anxiety, back pain, nausea, shortness of breath and vomiting   Associated symptoms: no abdominal pain, no altered mental status, no cough, no diaphoresis, no fatigue, no fever, no headache, no heartburn, no lower extremity edema, no palpitations and no syncope   Risk factors: no coronary artery disease, no diabetes mellitus, no high cholesterol, no hypertension, not female and no prior DVT/PE   Emesis Associated symptoms: no abdominal pain, no cough, no fever and no headaches    50 year old female with a history of anxiety, bipolar disorder, depression, migraine headaches, PTSD who presents to the emergency department with roughly 3 hours of chest pain.  Associated nausea and vomiting with loose stools.  Patient states that she has a history of panic attacks with associated chest pain.  She states that her chest pain this time feels different.  She describes a sudden onset of nausea and vomiting that was NBNB roughly 3 hours ago.  She then developed substernal chest pressure that radiated to her back, described as an elephant sitting on her chest.  She endorses persistent nausea, denies diaphoresis.  She denies any fever, chills, abdominal pain.  She Dors is mild shortness of breath.  She denies any recent infectious symptoms.  Nothing has made her chest pain better or worse.  She has not taken anything for the  discomfort.  She denies any history of exertional chest discomfort.  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 04/22/2015   Restless legs syndrome 04/22/2015   Sciatica 04/22/2015   Vitamin D deficiency 04/22/2015   Bipolar disorder, current episode depressed, severe, without psychotic features (HCC)    GAD (generalized anxiety disorder) 04/28/2014   Depressive episode 04/26/2014   Suicidal ideation    Bipolar affective disorder (HCC)    Bipolar 1 disorder with moderate mania (HCC) 02/11/2014   Bipolar disorder, unspecified (HCC) 06/15/2013   Polypharmacy 01/08/2013   Encounter for long-term (current) use of high-risk medication 01/08/2013   Psoriatic arthropathy (HCC) 01/24/2012   Psoriasis 08/05/2011   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     Family History  Problem Relation Age of Onset   Alcohol abuse Sister    Depression Sister  Alcohol abuse Maternal Grandfather    Multiple sclerosis Mother     Social History   Tobacco Use   Smoking status: Never   Smokeless tobacco: Never  Vaping Use   Vaping Use: Never used  Substance Use Topics   Alcohol use: Yes    Comment: once a month   Drug use: No    Home Medications Prior to Admission medications   Medication Sig Start Date End Date Taking? Authorizing Provider  ARIPiprazole  (ABILIFY) 5 MG tablet Take 5 mg by mouth daily. 10/19/20  Yes [provider]  buPROPion (WELLBUTRIN XL) 150 MG 24 hr tablet Take 450 mg by mouth daily. 10/21/20  Yes [provider]  cetirizine (ZYRTEC) 10 MG tablet Take 10 mg by mouth every evening.   Yes [provider]  divalproex (DEPAKOTE ER) 250 MG 24 hr tablet Take 750 mg by mouth at bedtime.   Yes [provider]  HUMIRA PEN 40 MG/0.4ML PNKT Inject 40 mg into the skin every 14 (fourteen) days. 11/13/20  Yes [provider]  norgestimate-ethinyl estradiol (ORTHO-CYCLEN,SPRINTEC,PREVIFEM) 0.25-35 MG-MCG tablet Take 1 tablet by mouth every evening.   Yes [provider]  rizatriptan (MAXALT-MLT) 10 MG disintegrating tablet Take 10 mg by mouth as needed for migraine. May repeat in 2 hours if needed   Yes [provider]  topiramate (TOPAMAX) 50 MG tablet Take 50 mg by mouth at bedtime.   Yes [provider]  VRAYLAR 4.5 MG CAPS Take 4.5 mg by mouth at bedtime. 10/08/20  Yes [provider]    Allergies    Prednisone  Review of Systems   Review of Systems  Constitutional:  Negative for diaphoresis, fatigue and fever.  Respiratory:  Positive for shortness of breath. Negative for cough.   Cardiovascular:  Positive for chest pain. Negative for palpitations and syncope.  Gastrointestinal:  Positive for nausea and vomiting. Negative for abdominal pain and heartburn.  Musculoskeletal:  Positive for back pain.  Neurological:  Negative for headaches.  All other systems reviewed and are negative.  Physical Exam Updated Vital Signs BP (!) 99/52   Pulse 82   Temp 99.2 F (37.3 C) (Oral)   Resp 17   SpO2 99%   Physical Exam Vitals and nursing note reviewed.  Constitutional:      General: She is not in acute distress.    Appearance: She is well-developed. She is not diaphoretic.  HENT:     Head: Normocephalic and atraumatic.  Eyes:     Conjunctiva/sclera:  Conjunctivae normal.  Cardiovascular:     Rate and Rhythm: Normal rate and regular rhythm.     Heart sounds: No murmur heard. Pulmonary:     Effort: Pulmonary effort is normal. No respiratory distress.     Breath sounds: Normal breath sounds.  Chest:     Chest wall: Tenderness present.     Comments: Chest wall tenderness palpation that reproduces the pain Abdominal:     Palpations: Abdomen is soft.     Tenderness: There is no abdominal tenderness.  Musculoskeletal:        General: No swelling or deformity.     Cervical back: Neck supple.  Skin:    General: Skin is warm and dry.  Neurological:     Mental Status: She is alert.  Psychiatric:        Mood and Affect: Mood is anxious.    ED Results / Procedures / Treatments   Labs (all labs ordered are listed,  but only abnormal results are displayed) Labs Reviewed  BASIC METABOLIC PANEL - Abnormal; Notable for the following components:      Result Value   Creatinine, Ser 1.06 (*)    All other components within normal limits  CBC  LIPASE, BLOOD  HEPATIC FUNCTION PANEL  I-STAT BETA HCG BLOOD, ED ([facility], [facility], [facility] ONLY)  TROPONIN I (HIGH SENSITIVITY)  TROPONIN I (HIGH SENSITIVITY)    EKG EKG Interpretation  Date/Time:  Wednesday December 23 2020 08:32:00 EDT Ventricular Rate:  82 PR Interval:  138 QRS Duration: 88 QT Interval:  378 QTC Calculation: 442 R Axis:   32 Text Interpretation: Sinus rhythm Low voltage, precordial leads Confirmed by Ernie Avena ([id number]) on 12/23/2020 9:30:49 AM  Radiology DG Chest 2 View  Result Date: 12/23/2020 CLINICAL DATA:  Chest pain EXAM: CHEST - 2 VIEW COMPARISON:  Chest radiograph 06/07/2017 FINDINGS: The cardiomediastinal silhouette is normal. There is no focal consolidation or pulmonary edema. There is no pleural effusion or pneumothorax. There is no acute osseous abnormality. IMPRESSION: No radiographic evidence of acute cardiopulmonary process. Electronically Signed   By: Lesia Hausen M.D.    On: 12/23/2020 08:27    Procedures Procedures   Medications Ordered in ED Medications  LORazepam (ATIVAN) tablet 1 mg (1 mg Oral Given 12/23/20 0907)  ondansetron (ZOFRAN-ODT) disintegrating tablet 4 mg (4 mg Oral Given 12/23/20 0907)  ketorolac (TORADOL) 15 MG/ML injection 30 mg (30 mg Intramuscular Given 12/23/20 1232)    ED Course  I have reviewed the triage vital signs and the nursing notes.  Pertinent labs & imaging results that were available during my care of the patient were reviewed by me and considered in my medical decision making (see chart for details).    MDM Rules/Calculators/A&P HEAR Score: 2                         Katelyn [name] is a 50 y.o. female who presented to the Emergency Department c/o chest pain. Past medical records have been reviewed and are notable for anxiety   Pertinent exam findings include:chest wall tenderness to palpation to the right of the sternum  EKG: Normal sinus rhythm with a rate of 82 and no evidence of acute ischemic changes, abnormal intervals, or dysrhythmia. No concerning change from prior   Lab results include: Troponins x2 negative, CBC without a leukocytosis, anemia or platelet abnormality, BMP unremarkable, hepatic function panel unremarkable, lipase normal, beta-hCG normal.  Imaging results include: Chest x-ray without acute cardiac or pulmonary abnormality.  Course of tx has consisted of: Ativan 1 mg p.o., Toradol 30 mg IM.  Zofran ODT.  Differential diagnosis includes: ACS, pneumonia, pneumothorax, pulmonary embolism,pericarditis/myocarditis, GERD, PUD, musculoskeletal. HEART score of 2, low risk.   Thought process: 50 year old female presenting to the emergency department with sudden onset nausea, vomiting, chest discomfort described as pressure in the right side of her chest.  On exam she does have tenderness to palpation of the right side of her chest.  Her nausea and vomiting have since resolved.  She denies any  specific abdominal pain.  She is status postcholecystectomy.  EKG revealed no ischemic changes.  Chest x-ray with no acute cardiac or pulm abnormality.  Troponins x2 were negative.   PERC negative. Labs unremarkable.  Unlikely pneumonia, no cough, no leukocytosis, no fevers, CXR and exam without acute findings. Unlikely pneumothorax, no findings on  CXR. Unlikely pericarditis/myocarditis, does not fit clinical picture. Chest pain not exertional. Unlikely dissection,  no pulse deficit, no tearing chest pain, no neurologic complaints. Due to patient's high risk will rule out ACS.   Patient's clinical presentation is most consistent with anxiety, potential costochondritis with chest wall discomfort.  Patient was advised NSAIDs for pain control.  Symptomatically improved somewhat following Ativan.  Toradol administered as well.  Advised patient of negative findings.  The patient has been appropriately medically screened and/or stabilized in the ED. I have low suspicion for any other emergent medical condition which would require further screening, evaluation or treatment in the ED or require inpatient management.  Patient's nausea, vomiting appears to have improved following symptomatic treatment. Recommended outpatient follow-up as needed.  Final Clinical Impression(s) / ED Diagnoses Final diagnoses:  Chest pain, unspecified type  Anxiety  Chest wall pain  Nausea vomiting and diarrhea    Rx / DC Orders ED Discharge Orders     None        Ernie Avena, MD 12/23/20 1948

## 2020-12-23 NOTE — Discharge Instructions (Addendum)
Recommend Motrin 800mg  for your chest wall pain every 8 hours for the next three days.

## 2021-03-25 ENCOUNTER — Other Ambulatory Visit: Payer: Self-pay

## 2021-03-25 ENCOUNTER — Emergency Department (HOSPITAL_COMMUNITY)
Admission: EM | Admit: 2021-03-25 | Discharge: 2021-03-25 | Disposition: A | Discharge status: Home / Self Care | Payer: BLUE CROSS/BLUE SHIELD | Attending: Emergency Medicine | Admitting: Emergency Medicine

## 2021-03-25 ENCOUNTER — Emergency Department (HOSPITAL_COMMUNITY): Payer: BLUE CROSS/BLUE SHIELD

## 2021-03-25 ENCOUNTER — Encounter (HOSPITAL_COMMUNITY): Payer: Self-pay | Admitting: Emergency Medicine

## 2021-03-25 DIAGNOSIS — M5442 Lumbago with sciatica, left side: Secondary | ICD-10-CM

## 2021-03-25 DIAGNOSIS — M545 Low back pain, unspecified: Secondary | ICD-10-CM | POA: Diagnosis present

## 2021-03-25 DIAGNOSIS — R000 Tachycardia, unspecified: Secondary | ICD-10-CM | POA: Diagnosis not present

## 2021-03-25 DIAGNOSIS — G8929 Other chronic pain: Secondary | ICD-10-CM

## 2021-03-25 MED ORDER — LIDOCAINE 5 % EX PTCH: 1.0000 | MEDICATED_PATCH | CUTANEOUS | 0 refills | Status: AC

## 2021-03-25 MED ORDER — NAPROXEN 500 MG PO TABS: 500.0000 mg | ORAL_TABLET | Freq: Two times a day (BID) | ORAL | 0 refills | Status: AC

## 2021-03-25 MED ORDER — HYDROCODONE-ACETAMINOPHEN 5-325 MG PO TABS: 1.0000 | ORAL_TABLET | ORAL | 0 refills | Status: AC | PRN

## 2021-03-25 MED ORDER — KETOROLAC TROMETHAMINE 15 MG/ML IJ SOLN
15.0000 mg | Freq: Once | INTRAMUSCULAR | Status: AC
  Administered 2021-03-25: 15 mg via INTRAVENOUS
  Filled 2021-03-25: qty 1

## 2021-03-25 MED ORDER — HYDROCODONE-ACETAMINOPHEN 5-325 MG PO TABS
1.0000 | ORAL_TABLET | Freq: Once | ORAL | Status: AC
  Administered 2021-03-25: 1 via ORAL
  Filled 2021-03-25: qty 1

## 2021-03-25 NOTE — ED Provider Notes (Signed)
[facility]-EMERGENCY DEPT Provider Note   CSN: [id number] Arrival date & time: 03/25/21  0115     History Chief Complaint  Patient presents with   Back Pain    Katelyn [name] is a 50 y.o. female.  50 year old female with past medical history of low back pain with sciatica presents today for evaluation of flareup of her pain.  Patient reports yesterday she was walking up a flight of stairs into her apartment when suddenly she had sudden onset of severe pain that has been present since.  She states she has not been able to find much relief outside of when she received 50 mcg of fentanyl with EMS in route which gave her relief for about 45 minutes.  She has ambulated briefly when she let the EMS into her apartment.  She has done physical therapy previously for the same pain.  She denies fever, chills, trauma to her back, saddle anesthesia.  The history is provided by the patient. No language interpreter was used.      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 04/22/2015   Restless legs syndrome 04/22/2015   Sciatica 04/22/2015   Vitamin D deficiency 04/22/2015   Bipolar disorder, current episode depressed, severe, without psychotic features (HCC)    GAD (generalized anxiety disorder) 04/28/2014   Depressive episode 04/26/2014   Suicidal ideation    Bipolar affective disorder (HCC)    Bipolar 1 disorder with moderate mania (HCC) 02/11/2014   Bipolar disorder, unspecified (HCC) 06/15/2013   Polypharmacy 01/08/2013   Encounter for long-term (current) use of  high-risk medication 01/08/2013   Psoriatic arthropathy (HCC) 01/24/2012   Psoriasis 08/05/2011   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     Family History  Problem Relation Age of Onset   Alcohol abuse Sister    Depression Sister    Alcohol abuse Maternal Grandfather    Multiple sclerosis Mother     Social History   Tobacco Use   Smoking status: Never   Smokeless tobacco: Never  Vaping Use   Vaping Use: Never used  Substance Use Topics   Alcohol use: Yes    Comment: once a month   Drug use: No    Home Medications Prior to Admission medications   Medication Sig Start Date End Date Taking? Authorizing Provider  ARIPiprazole (ABILIFY) 5 MG tablet Take 5 mg by mouth daily. 10/19/20   [provider]  buPROPion (WELLBUTRIN XL) 150 MG 24 hr tablet Take 450 mg by mouth daily. 10/21/20   [provider]  cetirizine (ZYRTEC) 10 MG tablet Take 10 mg by mouth every evening.    [provider]  divalproex (DEPAKOTE ER) 250 MG 24 hr tablet Take 750 mg by mouth at bedtime.    [provider]  HUMIRA PEN  40 MG/0.4ML PNKT Inject 40 mg into the skin every 14 (fourteen) days. 11/13/20   [provider]  norgestimate-ethinyl estradiol (ORTHO-CYCLEN,SPRINTEC,PREVIFEM) 0.25-35 MG-MCG tablet Take 1 tablet by mouth every evening.    [provider]  rizatriptan (MAXALT-MLT) 10 MG disintegrating tablet Take 10 mg by mouth as needed for migraine. May repeat in 2 hours if needed    [provider]  topiramate (TOPAMAX) 50 MG tablet Take 50 mg by mouth at bedtime.    [provider]  VRAYLAR 4.5 MG CAPS Take 4.5 mg by mouth at bedtime. 10/08/20   [provider]    Allergies     Prednisone  Review of Systems   Review of Systems  Constitutional:  Negative for appetite change, chills, fever and unexpected weight change.  Gastrointestinal:  Negative for abdominal pain, nausea and vomiting.  Genitourinary:  Negative for difficulty urinating.  Musculoskeletal:  Positive for back pain.  All other systems reviewed and are negative.  Physical Exam Updated Vital Signs BP (!) 113/57   Pulse (!) 102   Temp 99.2 F (37.3 C) (Oral)   Resp 16   Ht 5\' 2"  (1.575 m)   Wt 65.8 kg   LMP 03/25/2021 (Exact Date)   SpO2 99%   BMI 26.52 kg/m   Physical Exam Vitals and nursing note reviewed.  Constitutional:      General: She is not in acute distress.    Appearance: Normal appearance. She is not ill-appearing.  HENT:     Head: Normocephalic and atraumatic.     Nose: Nose normal.  Eyes:     Conjunctiva/sclera: Conjunctivae normal.  Cardiovascular:     Rate and Rhythm: Regular rhythm. Tachycardia present.  Pulmonary:     Effort: Pulmonary effort is normal. No respiratory distress.  Abdominal:     General: There is no distension.     Palpations: Abdomen is soft.     Tenderness: There is no abdominal tenderness. There is no guarding.  Musculoskeletal:        General: No deformity.     Comments: Patient without visual deformity to her spine.  She does have tenderness to palpation over just the lumbar spine and left paraspinal muscles.  She has positive straight leg raise test on the left lower extremity.  She does have 5/5 strength in bilateral hips, bilateral knees, bilateral ankles.  She is able to stand and weight-bear and take a step, but states she feels unstable.   Skin:    Findings: No rash.  Neurological:     Mental Status: She is alert.    ED Results / Procedures / Treatments   Labs (all labs ordered are listed, but only abnormal results are displayed) Labs Reviewed - No data to display  EKG None  Radiology No results  found.  Procedures Procedures   Medications Ordered in ED Medications  HYDROcodone-acetaminophen (NORCO/VICODIN) 5-325 MG per tablet 1 tablet (has no administration in time range)    ED Course  I have reviewed the triage vital signs and the nursing notes.  Pertinent labs & imaging results that were available during my care of the patient were reviewed by me and considered in my medical decision making (see chart for details).    MDM Rules/Calculators/A&P                           50 year old female with a history of disc herniation and sciatica presents today for evaluation of  worsening in her pain since yesterday.  Patient's lumbar x-ray without acute fracture or findings.  Patient given Norco and Toradol injection in the emergency room with mild relief.  Will discharge patient on Norco, lidocaine patch, naproxen.  Patient is appropriate for discharge.  She is without red flags concerning for cauda equina, or spinal epidural abscess.  Return precautions discussed with patient at length.  Discussed importance of follow-up with her primary care provider for reevaluation.  Patient voices understanding and is in agreement with plan.  Final Clinical Impression(s) / ED Diagnoses Final diagnoses:  None    Rx / DC Orders ED Discharge Orders     None        Evlyn Courier, PA-C 03/25/21 1312    Blanchie Dessert, MD 03/25/21 1314

## 2021-03-25 NOTE — Discharge Instructions (Addendum)
Your back x-ray was negative for any fractures.  Your exam was reassuring for any serious cause of your back pain.  Given your history of disc herniation this is likely a flareup of that pain.  You received a dose of Norco and Toradol injection in the emergency room.  I have also prescribed a 3-day course of Norco, naproxen, and lidocaine patches for you to use at home.  I recommend you call your primary care provider to schedule an appointment to be seen for reevaluation.  In the meantime if you develop worsening pain, weakness, fever, or difficulty urinating or with bowel movements please return to the emergency room for evaluation.

## 2021-03-25 NOTE — ED Triage Notes (Signed)
Pt arrives EMS from home c/o lower back pain after walking up stairs. Hx of herniated discs and similar recurrent events.   fentanyl administered IVP per ems prior to arrival.

## 2023-07-03 IMAGING — CR DG LUMBAR SPINE COMPLETE 4+V
6 series · 6 of 6 positions shown · non-contrast
Comparison: None.

CLINICAL DATA: Low back pain radiating to the left leg over the
last day.

EXAM:
LUMBAR SPINE - COMPLETE 4+ VIEW

[t lumbar spine ap]
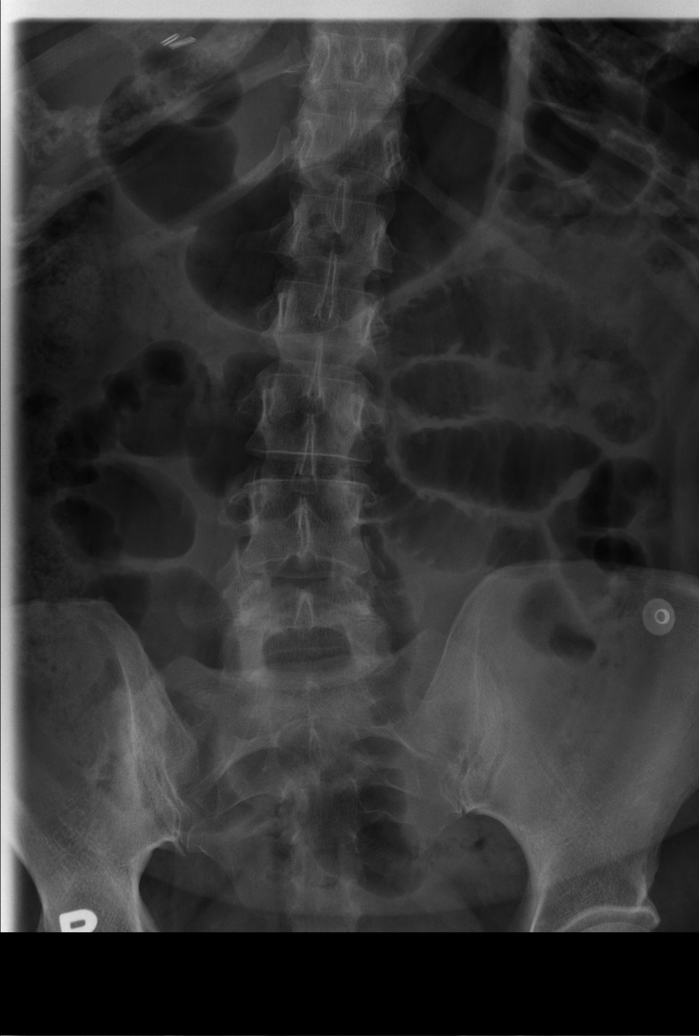

[t lumbar spine obl (1 of 3)]
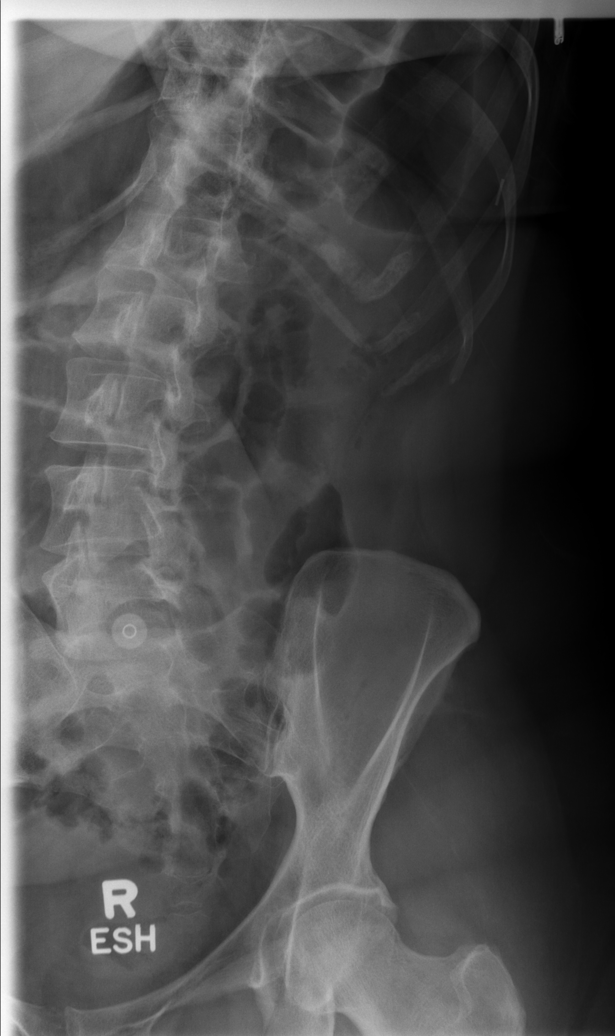

[t lumbar spine obl (2 of 3)]
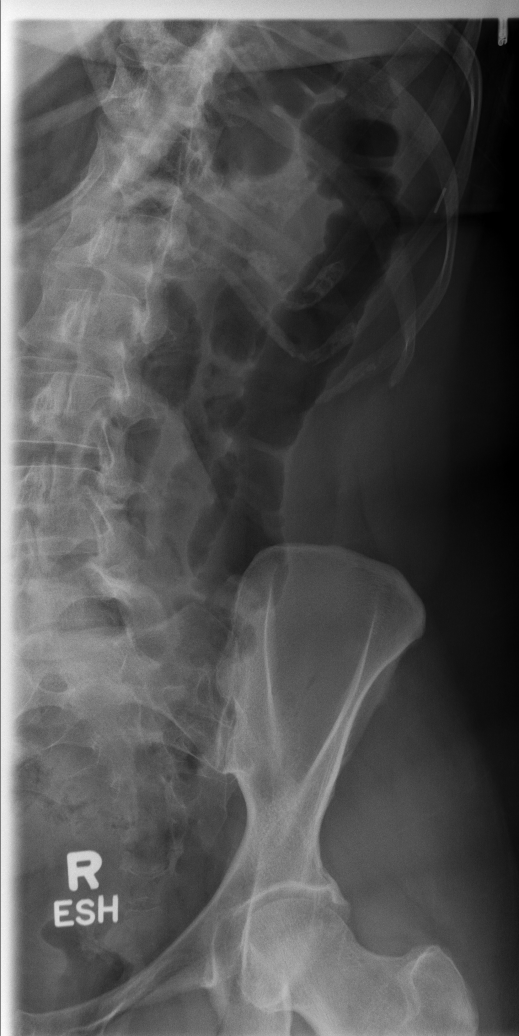

[t lumbar spine obl (3 of 3)]
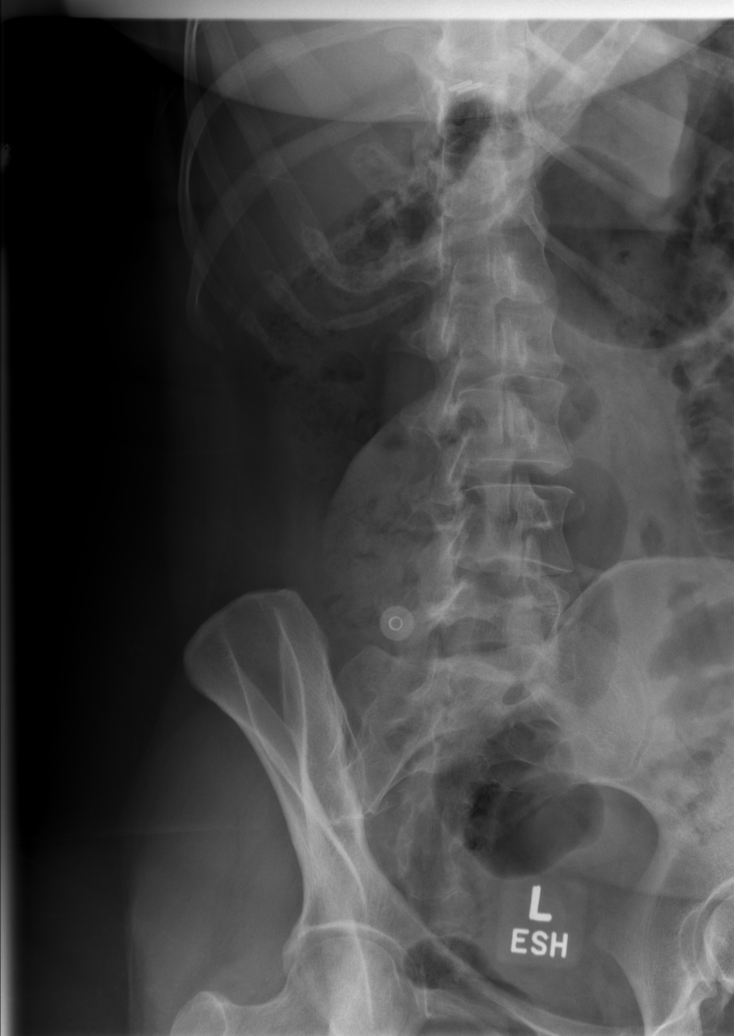

[t lumbar spine lat]
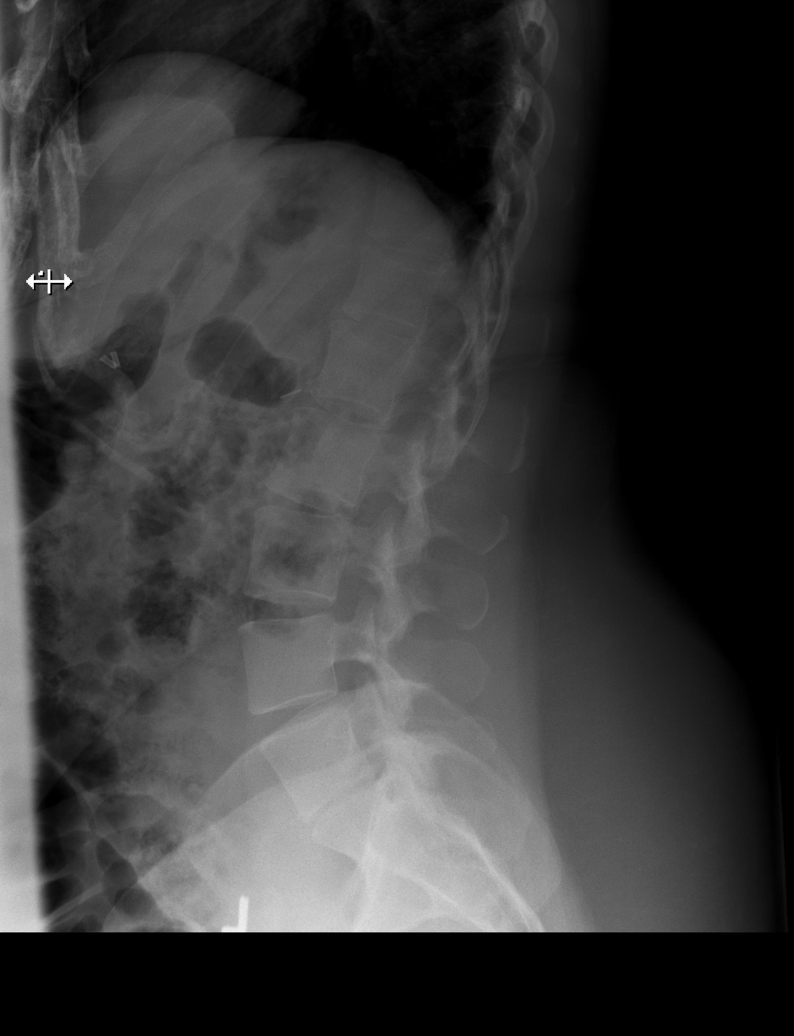

[t lumbar l-5 s-1 spot]
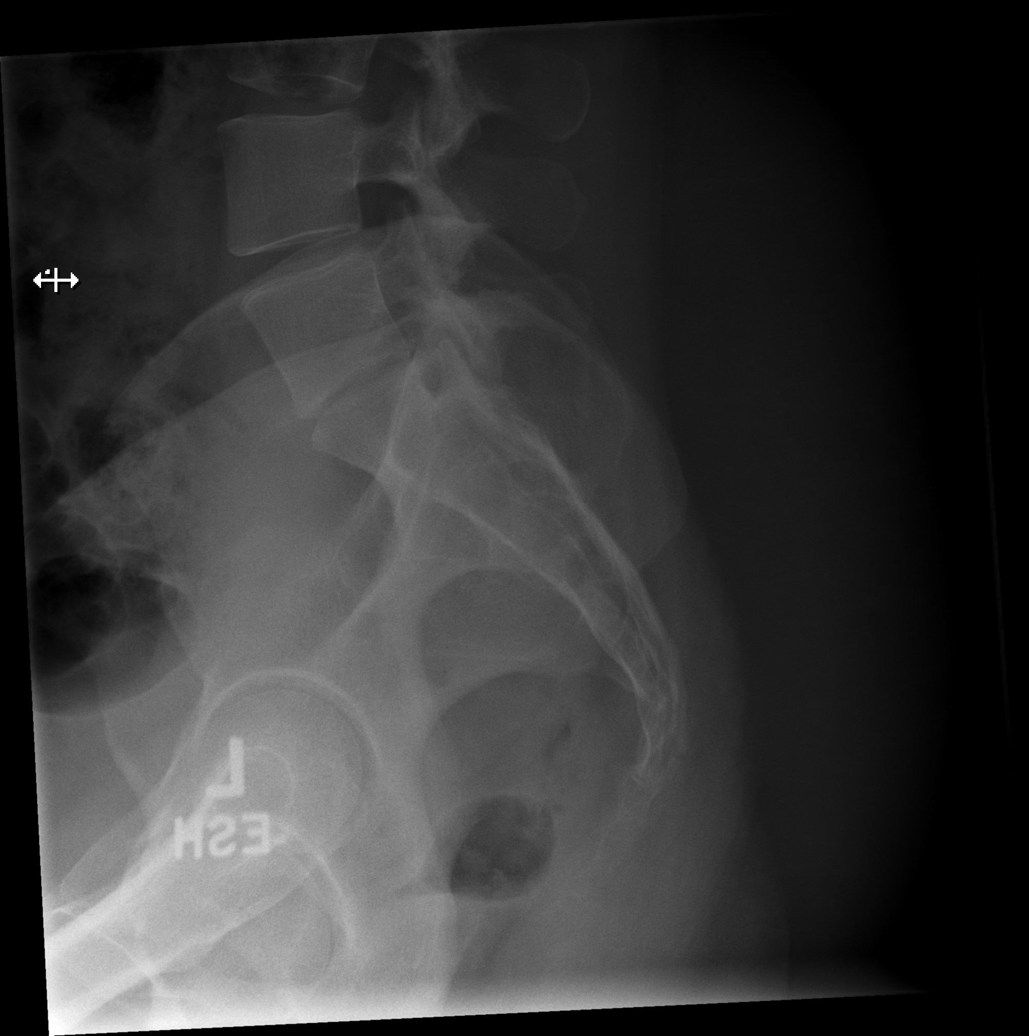

[6 of 6 positions shown; findings below may reference images not displayed]

FINDINGS: Mild scoliotic curvature convex to the right. Disc space heights are
normal except for narrowing at the L5-S1 level. No evidence of facet
arthropathy or pars defect. No fracture or focal lesion. Sacroiliac
joints appear normal.
IMPRESSION: Curvature convex to the right. Disc space narrowing at the L5-S1
level consistent with degenerative disc disease.
